# Patient Record
Sex: Male | Born: 1985
Health system: Southern US, Community
[De-identification: ages and names within clinical notes are randomized; demographics above are authoritative.]

## PROBLEM LIST (undated history)

## (undated) DIAGNOSIS — G4733 Obstructive sleep apnea (adult) (pediatric): Secondary | ICD-10-CM

## (undated) DIAGNOSIS — Z9989 Dependence on other enabling machines and devices: Secondary | ICD-10-CM

## (undated) DIAGNOSIS — T7840XA Allergy, unspecified, initial encounter: Secondary | ICD-10-CM

## (undated) DIAGNOSIS — R002 Palpitations: Secondary | ICD-10-CM

## (undated) DIAGNOSIS — G4730 Sleep apnea, unspecified: Secondary | ICD-10-CM

## (undated) DIAGNOSIS — Z87442 Personal history of urinary calculi: Secondary | ICD-10-CM

## (undated) DIAGNOSIS — Q248 Other specified congenital malformations of heart: Secondary | ICD-10-CM

## (undated) HISTORY — DX: Palpitations: R00.2

## (undated) HISTORY — DX: Obstructive sleep apnea (adult) (pediatric): G47.33

## (undated) HISTORY — DX: Sleep apnea, unspecified: G47.30

## (undated) HISTORY — PX: WISDOM TOOTH EXTRACTION: SHX21

## (undated) HISTORY — PX: MULTIPLE TOOTH EXTRACTIONS: SHX2053

## (undated) HISTORY — DX: Dependence on other enabling machines and devices: Z99.89

## (undated) HISTORY — DX: Allergy, unspecified, initial encounter: T78.40XA

---

## 2006-07-24 ENCOUNTER — Emergency Department (HOSPITAL_COMMUNITY): Admission: EM | Admit: 2006-07-24 | Discharge: 2006-07-24 | Payer: Self-pay | Admitting: Emergency Medicine

## 2012-06-29 ENCOUNTER — Emergency Department (HOSPITAL_COMMUNITY): Payer: 59

## 2012-06-29 ENCOUNTER — Emergency Department (HOSPITAL_COMMUNITY)
Admission: EM | Admit: 2012-06-29 | Discharge: 2012-06-29 | Disposition: A | Discharge status: Home / Self Care | Payer: 59 | Attending: Emergency Medicine | Admitting: Emergency Medicine

## 2012-06-29 ENCOUNTER — Encounter (HOSPITAL_COMMUNITY): Payer: Self-pay | Admitting: Emergency Medicine

## 2012-06-29 DIAGNOSIS — N201 Calculus of ureter: Secondary | ICD-10-CM

## 2012-06-29 DIAGNOSIS — R109 Unspecified abdominal pain: Secondary | ICD-10-CM | POA: Insufficient documentation

## 2012-06-29 LAB — URINALYSIS, ROUTINE W REFLEX MICROSCOPIC
Ketones, ur: NEGATIVE mg/dL
Leukocytes, UA: NEGATIVE
Nitrite: NEGATIVE
pH: 6 (ref 5.0–8.0)

## 2012-06-29 LAB — URINE MICROSCOPIC-ADD ON

## 2012-06-29 MED ORDER — IBUPROFEN 600 MG PO TABS: 600.0000 mg | ORAL_TABLET | Freq: Three times a day (TID) | ORAL | Status: AC | PRN

## 2012-06-29 MED ORDER — KETOROLAC TROMETHAMINE 30 MG/ML IJ SOLN
30.0000 mg | Freq: Once | INTRAMUSCULAR | Status: AC
  Administered 2012-06-29: 30 mg via INTRAVENOUS
  Filled 2012-06-29: qty 1

## 2012-06-29 MED ORDER — HYDROMORPHONE HCL PF 2 MG/ML IJ SOLN
2.0000 mg | Freq: Once | INTRAMUSCULAR | Status: AC
  Administered 2012-06-29: 2 mg via INTRAVENOUS
  Filled 2012-06-29: qty 1

## 2012-06-29 MED ORDER — ONDANSETRON HCL 4 MG/2ML IJ SOLN
4.0000 mg | Freq: Once | INTRAMUSCULAR | Status: AC
  Administered 2012-06-29: 4 mg via INTRAVENOUS
  Filled 2012-06-29: qty 2

## 2012-06-29 MED ORDER — TAMSULOSIN HCL 0.4 MG PO CAPS: 0.4000 mg | ORAL_CAPSULE | Freq: Every day | ORAL | Status: DC

## 2012-06-29 MED ORDER — SODIUM CHLORIDE 0.9 % IV SOLN
Freq: Once | INTRAVENOUS | Status: AC
  Administered 2012-06-29: 03:00:00 via INTRAVENOUS

## 2012-06-29 MED ORDER — OXYCODONE-ACETAMINOPHEN 5-325 MG PO TABS: 2.0000 | ORAL_TABLET | ORAL | Status: AC | PRN

## 2012-06-29 MED ORDER — ONDANSETRON 8 MG PO TBDP: 8.0000 mg | ORAL_TABLET | Freq: Three times a day (TID) | ORAL | Status: AC | PRN

## 2012-06-29 NOTE — ED Notes (Signed)
Pt alert, nad, c/o right flank pain, onset this evening, denies trauma or injury, denies changes in bowel or bladder, describes pain as sharp non radiating

## 2012-06-29 NOTE — ED Provider Notes (Signed)
History     CSN: [id number]  Arrival date & time 06/29/12  0137   First MD Initiated Contact with Patient 06/29/12 0155      Chief Complaint  Patient presents with  . Flank Pain    (Consider location/radiation/quality/duration/timing/severity/associated sxs/prior treatment) The history is provided by the patient.   the patient reports developing severe right flank pain that began approximately 35 minutes ago.  Reports the pain waxes and wanes.  When his pain becomes severe it is a 10 out of 10.  His pain is described as sharp.  Nothing worsens the symptoms.  Nothing improves his symptoms.  He's never had symptoms like this before.  Her history of kidney stones.  There is no radiation of his pain.  He has no fevers or chills.  He denies dysuria and urinary frequency.  he's had nausea and vomiting.  He denies diarrhea  History reviewed. No pertinent past medical history.  History reviewed. No pertinent past surgical history.  No family history on file.  History  Substance Use Topics  . Smoking status: Never Smoker   . Smokeless tobacco: Not on file  . Alcohol Use: No      Review of Systems  Genitourinary: Positive for flank pain.  All other systems reviewed and are negative.    Allergies  Review of patient's allergies indicates no known allergies.  Home Medications  No current outpatient prescriptions on file.  BP 148/90  Pulse 71  Temp 97.4 F (36.3 C) (Oral)  Resp 18  Wt 250 lb (113.399 kg)  SpO2 100%  Physical Exam  Nursing note and vitals reviewed. Constitutional: He is oriented to person, place, and time. He appears well-developed and well-nourished.  HENT:  Head: Normocephalic and atraumatic.  Eyes: EOM are normal.  Neck: Normal range of motion.  Cardiovascular: Normal rate, regular rhythm, normal heart sounds and intact distal pulses.   Pulmonary/Chest: Effort normal and breath sounds normal. No respiratory distress.  Abdominal: Soft. He exhibits no  distension. There is no tenderness.  Musculoskeletal: Normal range of motion.  Neurological: He is alert and oriented to person, place, and time.  Skin: Skin is warm and dry.  Psychiatric: He has a normal mood and affect. Judgment normal.    ED Course  Procedures (including critical care time)   Labs Reviewed  URINALYSIS, ROUTINE W REFLEX MICROSCOPIC   Ct Abdomen Pelvis Wo Contrast  06/29/2012  *RADIOLOGY REPORT*  Clinical Data: Right flank pain.  CT ABDOMEN AND PELVIS WITHOUT CONTRAST  Technique:  Multidetector CT imaging of the abdomen and pelvis was performed following the standard protocol without intravenous contrast.  Comparison: None.  Findings: The visualized lung bases are clear.  An apparent small epicardial cyst is noted, measuring 3.0 cm in size.  The liver and spleen are unremarkable in appearance.  The gallbladder is within normal limits.  The pancreas and adrenal glands are unremarkable.  There is very mild right-sided hydronephrosis, with mild perinephric stranding, and prominence of the right ureter to the level of an obstructing 4 mm stone in the distal right ureter, 2 cm proximal to the right vesicoureteral function.  The left kidney is unremarkable in appearance.  No nonobstructing renal stones are seen.  No free fluid is identified.  The small bowel is unremarkable in appearance.  The stomach is within normal limits.  No acute vascular abnormalities are seen.  The appendix is normal in caliber, without evidence for appendicitis.  The colon is unremarkable in appearance.  The  bladder is mildly distended and grossly unremarkable in appearance; a urachal remnant is incidentally noted.  No inguinal lymphadenopathy is seen.  No acute osseous abnormalities are identified.  IMPRESSION:  1.  Very mild right-sided hydronephrosis, with an obstructing 4 mm stone noted in the distal right ureter, 2 cm proximal to the right vesicoureteral junction. 2.  Small 3.0 cm epicardial cyst noted.   Original Report Authenticated By: Tonia Ghent, M.D.    I personally reviewed the imaging tests through PACS system    1. Right ureteral stone       MDM  The patient has had complete pain control.  He has a 4 mm right-sided ureteral stone.  He is well appearing and nontoxic.  He is given strict return instructions.  Urology followup.  The patient understands return the emergency department for new or worsening symptoms        Lyanne Co, MD 06/29/12 [phone]

## 2012-06-29 NOTE — ED Notes (Signed)
Pt c/o right flank pain that began about 30 mins ago. Pt states pain woke him from his sleep. Pt describes pain as stabbing. Pt denies chest pain, fever, headache, abdominal pain.

## 2017-11-08 ENCOUNTER — Ambulatory Visit (INDEPENDENT_AMBULATORY_CARE_PROVIDER_SITE_OTHER): Payer: BLUE CROSS/BLUE SHIELD | Admitting: Physician Assistant

## 2017-11-08 ENCOUNTER — Encounter: Payer: Self-pay | Admitting: Physician Assistant

## 2017-11-08 VITALS — BP 108/84 | HR 90 | Temp 98.3°F | Resp 16 | Ht 67.0 in | Wt 274.6 lb

## 2017-11-08 DIAGNOSIS — Z1389 Encounter for screening for other disorder: Secondary | ICD-10-CM

## 2017-11-08 DIAGNOSIS — Z13228 Encounter for screening for other metabolic disorders: Secondary | ICD-10-CM

## 2017-11-08 DIAGNOSIS — Z0000 Encounter for general adult medical examination without abnormal findings: Secondary | ICD-10-CM | POA: Diagnosis not present

## 2017-11-08 DIAGNOSIS — Z23 Encounter for immunization: Secondary | ICD-10-CM

## 2017-11-08 DIAGNOSIS — R0681 Apnea, not elsewhere classified: Secondary | ICD-10-CM

## 2017-11-08 DIAGNOSIS — Z130 Encounter for screening for diseases of the blood and blood-forming organs and certain disorders involving the immune mechanism: Secondary | ICD-10-CM | POA: Diagnosis not present

## 2017-11-08 DIAGNOSIS — Z13220 Encounter for screening for lipoid disorders: Secondary | ICD-10-CM | POA: Diagnosis not present

## 2017-11-08 LAB — POCT URINALYSIS DIP (MANUAL ENTRY)
Bilirubin, UA: NEGATIVE
Blood, UA: NEGATIVE
Glucose, UA: NEGATIVE mg/dL
Ketones, POC UA: NEGATIVE mg/dL
LEUKOCYTES UA: NEGATIVE
Nitrite, UA: NEGATIVE
PH UA: 5.5 (ref 5.0–8.0)
PROTEIN UA: NEGATIVE mg/dL
UROBILINOGEN UA: 0.2 U/dL

## 2017-11-08 NOTE — Progress Notes (Signed)
[name]  MRN: [id number] DOB: [birth year]  Subjective:  Pt is a 31 y.o. male who presents for annual physical exam. Pt is fasting today.   Last annual: Never Last dental exam: 2018, tries to brush twice daily Last vision exam: 2-3 years ago  Vaccinations      Tetanus: 2003      Influenza: never       Issues he would like to discuss: 1) Apneic episodes at night: Pt notes his wife has told him he wakes up gasping for air at least 3-4 times a night for the past 3 months. Has associated snoring at night. Often, he wakes up a lot not feeling completely rested. He will occasionally fall asleep while sitting and watching tv. He has fallen asleep while driving. He has not been in any accidents.    There are no active problems to display for this patient.   Current Outpatient Medications on File Prior to Visit  Medication Sig Dispense Refill  . cetirizine (ZYRTEC) 10 MG tablet Take 10 mg daily by mouth.    . pseudoephedrine (SUDAFED) 30 MG tablet Take 30 mg every 4 (four) hours as needed by mouth for congestion.     No current facility-administered medications on file prior to visit.     Allergies  Allergen Reactions  . Vicks Vaporub [Camph-Eucalypt-Men-Turp-Pet] Rash    Social History   Socioeconomic History  . Marital status: Single    Spouse name: None  . Number of children: None  . Years of education: None  . Highest education level: None  Social Needs  . Financial resource strain: None  . Food insecurity - worry: None  . Food insecurity - inability: None  . Transportation needs - medical: None  . Transportation needs - non-medical: None  Occupational History  . Occupation: Soil scientist.     Comment: [employer]  Tobacco Use  . Smoking status: Never Smoker  . Smokeless tobacco: Never Used  Substance and Sexual Activity  . Alcohol use: Yes    Comment: occ.  . Drug use: No  . Sexual activity: None  Other Topics Concern  . None  Social History  Narrative  . None    History reviewed. No pertinent surgical history.  Family History  Problem Relation Age of Onset  . Hypertension Father   . Hyperlipidemia Father   . Stomach cancer Maternal Grandfather   . Colon cancer Paternal Grandfather     Review of Systems  Constitutional: Negative for activity change, appetite change, chills, diaphoresis, fatigue, fever and unexpected weight change.  HENT: Positive for congestion, dental problem and ear pain. Negative for drooling, ear discharge, facial swelling, hearing loss, mouth sores, nosebleeds, postnasal drip, rhinorrhea, sinus pressure, sinus pain, sneezing, sore throat, tinnitus, trouble swallowing and voice change.   Eyes: Negative for photophobia, pain, discharge, redness, itching and visual disturbance.  Respiratory: Positive for apnea (episodes at night ). Negative for cough, choking, chest tightness, shortness of breath, wheezing and stridor.   Cardiovascular: Negative for chest pain, palpitations and leg swelling.  Gastrointestinal: Negative for abdominal distention, abdominal pain, anal bleeding, blood in stool, constipation, diarrhea, nausea, rectal pain and vomiting.  Endocrine: Negative for cold intolerance, heat intolerance, polydipsia, polyphagia and polyuria.  Genitourinary: Negative for decreased urine volume, difficulty urinating, discharge, dysuria, enuresis, flank pain, frequency, genital sores, hematuria, penile pain, penile swelling, scrotal swelling, testicular pain and urgency.  Musculoskeletal: Negative for arthralgias, back pain, gait problem, joint swelling, myalgias, neck pain  and neck stiffness.  Skin: Negative for color change, pallor, rash and wound.  Allergic/Immunologic: Negative for environmental allergies, food allergies and immunocompromised state.  Neurological: Negative for dizziness, tremors, seizures, syncope, facial asymmetry, speech difficulty, weakness, light-headedness, numbness and headaches.    Hematological: Negative for adenopathy. Does not bruise/bleed easily.  Psychiatric/Behavioral: Negative for agitation, behavioral problems, confusion, decreased concentration, dysphoric mood, hallucinations, self-injury, sleep disturbance and suicidal ideas. The patient is not nervous/anxious and is not hyperactive.     Objective:  BP 108/84   Pulse 90   Temp 98.3 F (36.8 C) (Oral)   Resp 16   Ht '5\' 7"'  (1.702 m)   Wt 274 lb 9.6 oz (124.6 kg)   SpO2 97%   BMI 43.01 kg/m   Physical Exam  Constitutional: He is oriented to person, place, and time and well-developed, well-nourished, and in no distress.  HENT:  Head: Normocephalic and atraumatic.  Right Ear: Hearing, tympanic membrane, external ear and ear canal normal.  Left Ear: Hearing, tympanic membrane, external ear and ear canal normal.  Nose: Mucosal edema (bilaterally) present.  Mouth/Throat: Uvula is midline, oropharynx is clear and moist and mucous membranes are normal. No oropharyngeal exudate.  Eyes: Conjunctivae and EOM are normal. Pupils are equal, round, and reactive to light.  Neck: Trachea normal and normal range of motion.  Neck circumference measures 48 cm.  Cardiovascular: Normal rate, regular rhythm, normal heart sounds and intact distal pulses.  Pulmonary/Chest: Effort normal and breath sounds normal.  Abdominal: Soft. Normal appearance and bowel sounds are normal.  Musculoskeletal: Normal range of motion.  Lymphadenopathy:       Head (right side): No submental, no submandibular, no tonsillar, no preauricular, no posterior auricular and no occipital adenopathy present.       Head (left side): No submental, no submandibular, no tonsillar, no preauricular, no posterior auricular and no occipital adenopathy present.    He has no cervical adenopathy.       Right: No supraclavicular adenopathy present.       Left: No supraclavicular adenopathy present.  Neurological: He is alert and oriented to person, place, and  time. He has normal sensation, normal strength and normal reflexes. Gait normal.  Skin: Skin is warm and dry.  Psychiatric: Affect normal.  Vitals reviewed.   Visual Acuity Screening   Right eye Left eye Both eyes  Without correction: '20/20 20/20 20/20 '  With correction:       Assessment and Plan :  Discussed healthy lifestyle, diet, exercise, preventative care, vaccinations, and addressed patient's concerns. Plan for follow up in one year. Otherwise, plan for specific conditions below.  1. Annual physical exam Await lab results.  2. Screening, anemia, deficiency, iron - CBC with Differential/Platelet  3. Screening for metabolic disorder - ZYY48+GNOI  4. Screening, lipi - Lipid panel  5. Screening for hematuria or proteinuria - POCT urinalysis dipstick  6. Need for Tdap vaccination - Tdap vaccine greater than or equal to 7yo IM  7. Apneic episode Due to hx of apneic episodes during sleep along with snoring, daytime sleepiness, obesity, and large neck circumference, pt would benefit from a sleep study at this time.  - Ambulatory referral to Sleep Studies  8. Need for immunization against influenza - Flu Vaccine QUAD 6+ mos PF IM (Fluarix Quad PF)  Tenna Delaine, PA-C  [facility] 11/08/2017 11:43 PM

## 2017-11-08 NOTE — Patient Instructions (Addendum)
Overall, your physical exam was great. Keep up the great work at exercising! Work on Standard Pacificchanging your diet to healthier options. We should have your lab work back within one week and will let you know your results. In terms of sleep study, I have placed a referral and they should contact you within 1-2 weeks. If you have not heard from them by then, please contact our office. It was great meeting you today! Thank you for letting me participate in your health and well being.    Health Maintenance, Male A healthy lifestyle and preventive care is important for your health and wellness. Ask your health care provider about what schedule of regular examinations is right for you. What should I know about weight and diet? Eat a Healthy Diet  Eat plenty of vegetables, fruits, whole grains, low-fat dairy products, and lean protein.  Do not eat a lot of foods high in solid fats, added sugars, or salt.  Maintain a Healthy Weight Regular exercise can help you achieve or maintain a healthy weight. You should:  Do at least 150 minutes of exercise each week. The exercise should increase your heart rate and make you sweat (moderate-intensity exercise).  Do strength-training exercises at least twice a week.  Watch Your Levels of Cholesterol and Blood Lipids  Have your blood tested for lipids and cholesterol every 5 years starting at 30 years of age. If you are at high risk for heart disease, you should start having your blood tested when you are 30 years old. You may need to have your cholesterol levels checked more often if: ? Your lipid or cholesterol levels are high. ? You are older than 30 years of age. ? You are at high risk for heart disease.  What should I know about cancer screening? Many types of cancers can be detected early and may often be prevented. Lung Cancer  You should be screened every year for lung cancer if: ? You are a current smoker who has smoked for at least 30 years. ? You are a  former smoker who has quit within the past 15 years.  Talk to your health care provider about your screening options, when you should start screening, and how often you should be screened.  Colorectal Cancer  Routine colorectal cancer screening usually begins at 30 years of age and should be repeated every 5-10 years until you are 30 years old. You may need to be screened more often if early forms of precancerous polyps or small growths are found. Your health care provider may recommend screening at an earlier age if you have risk factors for colon cancer.  Your health care provider may recommend using home test kits to check for hidden blood in the stool.  A small camera at the end of a tube can be used to examine your colon (sigmoidoscopy or colonoscopy). This checks for the earliest forms of colorectal cancer.  Prostate and Testicular Cancer  Depending on your age and overall health, your health care provider may do certain tests to screen for prostate and testicular cancer.  Talk to your health care provider about any symptoms or concerns you have about testicular or prostate cancer.  Skin Cancer  Check your skin from head to toe regularly.  Tell your health care provider about any new moles or changes in moles, especially if: ? There is a change in a mole's size, shape, or color. ? You have a mole that is larger than a pencil eraser.  Always use sunscreen. Apply sunscreen liberally and repeat throughout the day.  Protect yourself by wearing long sleeves, pants, a wide-brimmed hat, and sunglasses when outside.  What should I know about heart disease, diabetes, and high blood pressure?  If you are 54-34 years of age, have your blood pressure checked every 3-5 years. If you are 91 years of age or older, have your blood pressure checked every year. You should have your blood pressure measured twice-once when you are at a hospital or clinic, and once when you are not at a hospital or  clinic. Record the average of the two measurements. To check your blood pressure when you are not at a hospital or clinic, you can use: ? An automated blood pressure machine at a pharmacy. ? A home blood pressure monitor.  Talk to your health care provider about your target blood pressure.  If you are between 41-47 years old, ask your health care provider if you should take aspirin to prevent heart disease.  Have regular diabetes screenings by checking your fasting blood sugar level. ? If you are at a normal weight and have a low risk for diabetes, have this test once every three years after the age of 47. ? If you are overweight and have a high risk for diabetes, consider being tested at a younger age or more often.  A one-time screening for abdominal aortic aneurysm (AAA) by ultrasound is recommended for men aged 49-75 years who are current or former smokers. What should I know about preventing infection? Hepatitis B If you have a higher risk for hepatitis B, you should be screened for this virus. Talk with your health care provider to find out if you are at risk for hepatitis B infection. Hepatitis C Blood testing is recommended for:  Everyone born from [birth year] through 1964.  Anyone with known risk factors for hepatitis C.  Sexually Transmitted Diseases (STDs)  You should be screened each year for STDs including gonorrhea and chlamydia if: ? You are sexually active and are younger than 30 years of age. ? You are older than 30 years of age and your health care provider tells you that you are at risk for this type of infection. ? Your sexual activity has changed since you were last screened and you are at an increased risk for chlamydia or gonorrhea. Ask your health care provider if you are at risk.  Talk with your health care provider about whether you are at high risk of being infected with HIV. Your health care provider may recommend a prescription medicine to help prevent HIV  infection.  What else can I do?  Schedule regular health, dental, and eye exams.  Stay current with your vaccines (immunizations).  Do not use any tobacco products, such as cigarettes, chewing tobacco, and e-cigarettes. If you need help quitting, ask your health care provider.  Limit alcohol intake to no more than 2 drinks per day. One drink equals 12 ounces of beer, 5 ounces of wine, or 1 ounces of hard liquor.  Do not use street drugs.  Do not share needles.  Ask your health care provider for help if you need support or information about quitting drugs.  Tell your health care provider if you often feel depressed.  Tell your health care provider if you have ever been abused or do not feel safe at home. This information is not intended to replace advice given to you by your health care provider. Make sure you discuss any  questions you have with your health care provider. Document Released: 06/06/2008 Document Revised: 08/07/2016 Document Reviewed: 09/12/2015 Elsevier Interactive Patient Education  2018 [employer]   IF you received an x-ray today, you will receive an invoice from [facility]. Please contact [facility] at [phone] with questions or concerns regarding your invoice.   IF you received labwork today, you will receive an invoice from [facility]. Please contact [facility] at [phone] with questions or concerns regarding your invoice.   Our billing staff will not be able to assist you with questions regarding bills from these companies.  You will be contacted with the lab results as soon as they are available. The fastest way to get your results is to activate your My Chart account. Instructions are located on the last page of this paperwork. If you have not heard from us regarding the results in 2 weeks, please contact this office.

## 2017-11-09 LAB — CMP14+EGFR
ALT: 30 IU/L (ref 0–44)
AST: 21 IU/L (ref 0–40)
Albumin/Globulin Ratio: 1.5 (ref 1.2–2.2)
Albumin: 4.3 g/dL (ref 3.5–5.5)
Alkaline Phosphatase: 92 IU/L (ref 39–117)
BUN/Creatinine Ratio: 14 (ref 9–20)
BUN: 11 mg/dL (ref 6–20)
Bilirubin Total: 0.4 mg/dL (ref 0.0–1.2)
CALCIUM: 9.2 mg/dL (ref 8.7–10.2)
CO2: 23 mmol/L (ref 20–29)
CREATININE: 0.76 mg/dL (ref 0.76–1.27)
Chloride: 107 mmol/L — ABNORMAL HIGH (ref 96–106)
GFR calc Af Amer: 142 mL/min/{1.73_m2} (ref >59)
GFR, EST NON AFRICAN AMERICAN: 122 mL/min/{1.73_m2} (ref >59)
GLUCOSE: 86 mg/dL (ref 65–99)
Globulin, Total: 2.9 g/dL (ref 1.5–4.5)
Potassium: 4 mmol/L (ref 3.5–5.2)
SODIUM: 144 mmol/L (ref 134–144)
Total Protein: 7.2 g/dL (ref 6.0–8.5)

## 2017-11-09 LAB — CBC WITH DIFFERENTIAL/PLATELET
BASOS ABS: 0.1 10*3/uL (ref 0.0–0.2)
Basos: 1 %
EOS (ABSOLUTE): 0.2 10*3/uL (ref 0.0–0.4)
EOS: 3 %
HEMATOCRIT: 40.7 % (ref 37.5–51.0)
Hemoglobin: 14.4 g/dL (ref 13.0–17.7)
IMMATURE GRANULOCYTES: 0 %
Immature Grans (Abs): 0 10*3/uL (ref 0.0–0.1)
LYMPHS ABS: 2.7 10*3/uL (ref 0.7–3.1)
Lymphs: 29 %
MCH: 29.1 pg (ref 26.6–33.0)
MCHC: 35.4 g/dL (ref 31.5–35.7)
MCV: 82 fL (ref 79–97)
MONOS ABS: 0.6 10*3/uL (ref 0.1–0.9)
Monocytes: 7 %
NEUTROS PCT: 60 %
Neutrophils Absolute: 5.5 10*3/uL (ref 1.4–7.0)
PLATELETS: 287 10*3/uL (ref 150–379)
RBC: 4.95 x10E6/uL (ref 4.14–5.80)
RDW: 14 % (ref 12.3–15.4)
WBC: 9.1 10*3/uL (ref 3.4–10.8)

## 2017-11-09 LAB — LIPID PANEL
CHOL/HDL RATIO: 4.1 ratio (ref 0.0–5.0)
Cholesterol, Total: 178 mg/dL (ref 100–199)
HDL: 43 mg/dL (ref >39)
LDL CALC: 117 mg/dL — AB (ref 0–99)
TRIGLYCERIDES: 91 mg/dL (ref 0–149)
VLDL Cholesterol Cal: 18 mg/dL (ref 5–40)

## 2017-12-17 ENCOUNTER — Ambulatory Visit: Payer: BLUE CROSS/BLUE SHIELD | Admitting: Neurology

## 2017-12-17 ENCOUNTER — Encounter: Payer: Self-pay | Admitting: Neurology

## 2017-12-17 VITALS — BP 130/84 | HR 81 | Ht 67.0 in | Wt 278.0 lb

## 2017-12-17 DIAGNOSIS — J3081 Allergic rhinitis due to animal (cat) (dog) hair and dander: Secondary | ICD-10-CM | POA: Diagnosis not present

## 2017-12-17 DIAGNOSIS — Z6841 Body Mass Index (BMI) 40.0 and over, adult: Secondary | ICD-10-CM

## 2017-12-17 DIAGNOSIS — G4733 Obstructive sleep apnea (adult) (pediatric): Secondary | ICD-10-CM | POA: Diagnosis not present

## 2017-12-17 MED ORDER — TRIAMCINOLONE ACETONIDE 55 MCG/ACT NA AERO: 2.0000 | INHALATION_SPRAY | Freq: Every day | NASAL | 12 refills | Status: DC

## 2017-12-17 NOTE — Progress Notes (Signed)
SLEEP MEDICINE CLINIC   Provider:  Melvyn Novas, M D  Primary Care Physician:  Magdalene River, PA-C   Referring Provider: Magdalene River, PA*  Chief Complaint  Patient presents with  . New Patient (Initial Visit)    pt wife has told him he snores and stops breathing in his sleep. pt alone, rm 10.    HPI:  [name] is a 31 y.o. male , seen here  in a referral  from PA. Barnett Abu for an evaluation of sleep disordered breathing.  I have the pleasure of meeting with [name] today on 17 December 2017, Caucasian 31 year old married gentleman, right-handed who presents with a complaint of waking up gasping for air, his wife has witnessed him to have apnea and snoring.  He also feels that his sleep no longer has the same restorative and refreshing quality it used to have.  He wakes up up to 4 times at night not always sure what wakes him usually being able to go back to sleep he has on occasion fall asleep while sitting watching TV, and has fallen asleep while driving 2 month ago.   Sleep habits are as follows: The patient usually aims for a bedtime of 10 PM, while he is trying to go to sleep his wife does watch TV in the marital bedroom.  He himself prefers to watch TV in the den before he goes to bed.  He estimates 15 minutes of sleep latency, and then usually can sleep for several hours before waking up for the first time.  Aside from the running tv his bedroom, the bedroom is usually cool, quiet and dark.  The patient states that he goes more often to the bathroom when he actually has the urge to urinate, it is that he awakens for other reasons and then uses that interruption in sleep for a bathroom break. He wakes up with a very dry mouth, often times with diaphoresis, not palpitations.  He usually does not have headaches, dizziness or nausea. He estimates 7-8 hours of nocturnal sleep. Rises at 6.30 AM and starts working from home by 7.30 AM. His employer is in [place].    Sleep medical history and family sleep history:   The patient does not recollect having any childhood sleep disorders such as night terrors, enuresis or sleep walking.  Did not  undergo tonsillectomy. NO history of TBI or neck injury. No hospitalizations.  No Asthma , but sister is affected.   Social history: Patient is able to work from home, is a Geographical information systems officer.  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  His non-smoker, he drinks socially alcohol-3 drinks a month, he does consume caffeine.  He drinks hot tea in the morning, and disorders in the afternoon.  Around 5 caffeinated beverages a day he is not a shift Financial controller. Married, no children, one dog.   Review of Systems: Out of a complete 14 system review, the patient complains of only the following symptoms, and all other reviewed systems are negative. Obesity, snoring, apnea witnessed, increased anxiety and depression- stress related- separated before Christmas.   Epworth score 10 , Fatigue severity score 22  , depression score n/a -    Social History   Socioeconomic History  . Marital status: Married    Spouse name: [name]  . Number of children: 0  . Years of education: College  . Highest education level: Associate degree: academic program  Social Needs  . Financial  resource strain: Not hard at all  . Food insecurity - worry: Never true  . Food insecurity - inability: Never true  . Transportation needs - medical: No  . Transportation needs - non-medical: No  Occupational History  . Occupation: Art gallery managerData base Admin.     Comment: [employer]  Tobacco Use  . Smoking status: Never Smoker  . Smokeless tobacco: Never Used  Substance and Sexual Activity  . Alcohol use: Yes    Comment: occ.  . Drug use: No  . Sexual activity: Yes    Partners: Female    Comment: with monogamous wife  Other Topics Concern  . Not on file  Social History Narrative   Diet: Average American diet: breakfast is  cereal or oatmeal, lunch is typical a lean cuisine, dinner is typical spaghetti, meat and sides, tacos, and hamburgers. Eats veggies. Does not eat a lot of fruit. Drinks mostly diet sodas and sweet tea. Trying to drink more water.       Exercise: He is exercising 2-3 days a week. Goes to the park and does brisk walking for about an hour. Trying to increase it to 5 days a week.     Family History  Problem Relation Age of Onset  . Hypertension Father   . Hyperlipidemia Father   . Stomach cancer Maternal Grandfather   . Colon cancer Paternal Grandfather     Current Outpatient Medications  Medication Sig Dispense Refill  . cetirizine (ZYRTEC) 10 MG tablet Take 10 mg daily by mouth.    . pseudoephedrine (SUDAFED) 30 MG tablet Take 30 mg every 4 (four) hours as needed by mouth for congestion.     No current facility-administered medications for this visit.     Allergies as of 12/17/2017 - Review Complete 12/17/2017  Allergen Reaction Noted  . Vicks vaporub [camph-eucalypt-men-turp-pet] Rash 11/08/2017    Vitals: BP 130/84   Pulse 81   Ht 5\' 7"  (1.702 m)   Wt 278 lb (126.1 kg)   BMI 43.54 kg/m  Last Weight:  Wt Readings from Last 1 Encounters:  12/17/17 278 lb (126.1 kg)   ZOX:WRUEBMI:Body mass index is 43.54 kg/m.     Last Height:   Ht Readings from Last 1 Encounters:  12/17/17 5\' 7"  (1.702 m)    Physical exam:  General: The patient is awake, alert and appears not in acute distress. The patient is well groomed. Head: Normocephalic, atraumatic. Neck is supple. Mallampati 5 , neck circumference:19. 25 . Nasal airflow patent -  Retrognathia is seen. Small lower jaw.  Used to wear braces.  Cardiovascular:  Regular rate and rhythm, without  murmurs or carotid bruit, and without distended neck veins. Respiratory: Lungs are clear to auscultation. Skin:  Without evidence of edema, or rash Trunk: BMI is 44. The patient's posture is stooped.   Neurologic exam : The patient is awake and  alert, oriented to place and time.   Memory subjective described as intact.  Attention span & concentration ability appears normal.  Speech is fluent,  without dysarthria, dysphonia or aphasia.  Mood and affect are appropriate.  Cranial nerves: Pupils are equal and briskly reactive to light.Funduscopic exam without evidence of pallor or edema. Extraocular movements  in vertical and horizontal planes intact and without nystagmus. Visual fields by finger perimetry are intact.Hearing to finger rub intact. Facial sensation intact to fine touch.Facial motor strength is symmetric and tongue and uvula move midline. Shoulder shrug was symmetrical.   Motor exam:   Normal  tone, muscle bulk and symmetric strength in all extremities. Sensory:  Fine touch, pinprick and vibration were tested in all extremities. Proprioception tested in the upper extremities was normal. Coordination: Rapid alternating movements in the fingers/hands was normal. Finger-to-nose maneuver  normal without evidence of ataxia, dysmetria or tremor. Gait and station: Patient walks without assistive device .Tandem gait is unfragmented.  Deep tendon reflexes: in the  upper and lower extremities are symmetric and intact.    Assessment:  After physical and neurologic examination, review of laboratory studies,  Personal review of imaging studies, reports of other /same  Imaging studies, results of polysomnography and / or neurophysiology testing and pre-existing records as far as provided in visit., my assessment is   1) OSA ? [name] was observed and loud snoring, but he has at this point no significant comorbidities except for morbid obesity.  He does not have heart disease, pulmonary disorders, and neither does he suffer from diabetes and never had a stroke or heart attack. At this time I would like for him to undergo an attended  SPLIT sleep test, with a goal of measuring 3% oxygen desaturations as apnea screen him for the presence of  sleep apnea.  Treatment can be initiated after the AHI of 20 has been reached.   2) Obesity- BMI 40  weight management - refer to Dr. Dalbert GarnetBeasley.   3)  Fatigue and sleepiness,  Feeling overwhelmed can be related to non sleep issues, such as the very recent separation form his spouse.    The patient was advised of the nature of the diagnosed disorder , the treatment options and the  risks for general health and wellness arising from not treating the condition.   I spent more than 45 minutes of face to face time with the patient.  Greater than 50% of time was spent in counseling and coordination of care. We have discussed the diagnosis and differential and I answered the patient's questions.    Plan:  Treatment plan and additional workup :   SPLIT night, 3 % and SPLIT at AHI 20.  Sleep aid Amitriptyline- lowest dose.   Melvyn NovasARMEN Loula Marcella, MD 12/17/2017, 1:13 PM  Certified in Neurology by ABPN Certified in Sleep Medicine by [facility]  [facility] [address], [address] [place], [place] [id number]

## 2018-01-23 ENCOUNTER — Ambulatory Visit (INDEPENDENT_AMBULATORY_CARE_PROVIDER_SITE_OTHER): Payer: BLUE CROSS/BLUE SHIELD | Admitting: Neurology

## 2018-01-23 DIAGNOSIS — Z6841 Body Mass Index (BMI) 40.0 and over, adult: Secondary | ICD-10-CM

## 2018-01-23 DIAGNOSIS — G4733 Obstructive sleep apnea (adult) (pediatric): Secondary | ICD-10-CM

## 2018-01-23 DIAGNOSIS — J3081 Allergic rhinitis due to animal (cat) (dog) hair and dander: Secondary | ICD-10-CM

## 2018-01-30 ENCOUNTER — Other Ambulatory Visit: Payer: Self-pay | Admitting: Neurology

## 2018-01-30 DIAGNOSIS — G4733 Obstructive sleep apnea (adult) (pediatric): Secondary | ICD-10-CM

## 2018-01-30 DIAGNOSIS — G4734 Idiopathic sleep related nonobstructive alveolar hypoventilation: Principal | ICD-10-CM

## 2018-01-30 DIAGNOSIS — G4700 Insomnia, unspecified: Secondary | ICD-10-CM

## 2018-01-30 DIAGNOSIS — G4730 Sleep apnea, unspecified: Secondary | ICD-10-CM

## 2018-01-30 DIAGNOSIS — G4719 Other hypersomnia: Secondary | ICD-10-CM

## 2018-01-30 DIAGNOSIS — Z6841 Body Mass Index (BMI) 40.0 and over, adult: Secondary | ICD-10-CM

## 2018-01-30 NOTE — Procedures (Signed)
PATIENT'S NAME:  [name], [name] DOB:      09/03/1986      MR#:    [id number]     DATE OF RECORDING: 01/23/2018 REFERRING M.D.:  Jerry CoreBrittany Wiseman PA-C Study Performed:  Split-Night Titration Study HISTORY:  [name] is a right handed Caucasian 32 year old married man who presents with a complaint of waking up gasping for air. His wife has witnessed him to have apnea and snoring.  He also feels that his sleep no longer has the same restorative and refreshing quality it used to have.  He wakes 3 to 4 times at night - he has been falling asleep while sitting watching TV, and has fallen asleep while driving 2 month ago. He endorsed respiratory allergies and morbid obesity The patient endorsed the Epworth Sleepiness Scale at 10 points.   The patient's weight 278 pounds with a height of 67 (inches), resulting in a BMI of 43.6 kg/m2. The patient's neck circumference measured 19.3 inches.  CURRENT MEDICATIONS: Zyrtec, Sudafed,  PROCEDURE:  This is a multichannel digital polysomnogram utilizing the Somnostar 11.2 system.  Electrodes and sensors were applied and monitored per AASM Specifications.   EEG, EOG, Chin and Limb EMG, were sampled at 200 Hz.  ECG, Snore and Nasal Pressure, Thermal Airflow, Respiratory Effort, CPAP Flow and Pressure, Oximetry was sampled at 50 Hz. Digital video and audio were recorded.      BASELINE STUDY WITHOUT CPAP RESULTS: Lights Out was at 21:05 and Lights On at 05:00.  Total recording time (TRT) was 101.5, with a total sleep time (TST) of 91.5 minutes.   The patient's sleep latency was 3.5 minutes.  REM latency was 0 minutes.  The sleep efficiency was 90.1 %.    SLEEP ARCHITECTURE: WASO (Wake after sleep onset) was 0.5 minutes, Stage N1 was 5 minutes, Stage N2 was 84.5 minutes, Stage N3 was 2 minutes and Stage R (REM sleep) was 0 minutes.  The percentages were Stage N1 5.5%, Stage N2 92.3%, Stage N3 2.2% and Stage R (REM sleep) 0%.   RESPIRATORY ANALYSIS:  There  were a total of 127 respiratory events:  101 obstructive apneas, 26 hypopneas without additional respiratory event related arousals (RERAs). Loud Snoring was noted.    The total APNEA/HYPOPNEA INDEX (AHI) was 83.3 /hour and the total RESPIRATORY DISTURBANCE INDEX was 83.3 /hour.  0 events occurred in REM sleep and 52 events in NREM. The REM AHI was 0, /hour versus a non-REM AHI of 83.3 /hour. The patient spent 315.5 minutes of sleep time in the supine position, and 138 minutes in non-supine. The supine AHI was 83.2 /hour versus a non-supine AHI of 0.0 /hour.  OXYGEN SATURATION & C02:  The wake baseline 02 saturation was 96%, with the lowest being 69%. Time spent below 89% saturation equaled 81 minutes.  PERIODIC LIMB MOVEMENTS:   The patient had a total of 0 Periodic Limb Movements.   The arousals were noted as: 1 was spontaneous, 0 were associated with PLMs, and 112 were associated with respiratory events. Audio and video analysis did not show any abnormal or unusual movements, behaviors, phonations or vocalizations. No nocturia. NSR in EKG. Loud Snoring was noted   TITRATION STUDY WITH CPAP RESULTS:   CPAP was initiated at 6 cmH20 with heated humidity per AASM split night standards and pressure was advanced to 10 cmH20 because of hypopneas, apneas and desaturations.  At a PAP pressure of 10 cmH20, there was a sleep efficiency of 99.8%, and reduction of the  AHI to 0.0 /hour.   Total recording time (TRT) was 374 minutes, with a total sleep time (TST) of 361.5 minutes. The patient's sleep latency was 14.5 minutes. REM latency was 18.5 minutes.  The sleep efficiency was 96.7 %.    SLEEP ARCHITECTURE: Wake after sleep was 6.5 minutes, Stage N1 7.5 minutes, Stage N2 110.5 minutes, Stage N3 93 minutes and Stage R (REM sleep) 150.5 minutes. The percentages were: Stage N1 2.1%, Stage N2 30.6%, Stage N3 25.7% and Stage R (REM sleep) 41.6%. The sleep architecture was notable for supine REM sleep. RESPIRATORY  ANALYSIS:  There were a total of 13 respiratory events: 0 apneas and 13 hypopneas with 0 respiratory event related arousals (RERAs).   The total APNEA/HYPOPNEA INDEX (AHI) was 2.2 /hour and the total RESPIRATORY DISTURBANCE INDEX was 2.2 /hour.  2 events occurred in REM sleep and 11 events in NREM. The REM AHI was 0.8 /hour versus a non-REM AHI of 3.1 /hour. The patient spent 62% of total sleep time in the supine position. The supine AHI was 3.2 /hour, versus a non-supine AHI of 0.4/hour.  OXYGEN SATURATION & C02:  The wake baseline 02 saturation was 98%, with the lowest being 75%. Time spent below 89% saturation equaled 2 minutes.  PERIODIC LIMB MOVEMENTS:    The patient had a total of 0 Periodic Limb Movements. The arousals were noted as: 7 were spontaneous, 0 were associated with PLMs, and 4 were associated with respiratory events.  Post-study, the patient indicated that sleep was better than usual. The patient was fitted with a nasal pillow in small size, a ResMed AirFit P10.   POLYSOMNOGRAPHY IMPRESSION :   1. Severe Obstructive Sleep Apnea (OSA) at AHI of 83.3/hr. SPLIT protocol was initiated after only one hour of baseline study.  2. Severe intermittent hypoxia was associated with apnea, to a nadir of 69%.  3. Loud Primary Snoring. 4. All named conditions improved with CPAP titration.   RECOMMENDATIONS:  1. Advise to start the patient at auto CPAP of 8 through 13 cm H2O. A ResMed AirFit P 10 in small size was used with heated humidity during this study.  Advise to add heated humidity.  Adjust interface and heated humidity as needed.     2. Compliance to PAP-therapy should be emphasized. Compliance, AHI and air leak information to be downloaded for objective assessment at 30 days, 180 days and annually thereafter.   3. Advise to lose weight by diet and exercise if not contraindicated (BMI over 40). Further information regarding OSA may be obtained from [employer]  (www.sleepfoundation.org) or American Sleep Apnea Association (www.sleepapnea.org). 4. A follow up appointment will be scheduled in the Sleep Clinic at [facility].      I certify that I have reviewed the entire raw data recording prior to the issuance of this report in accordance with the Standards of Accreditation of the American Academy of Sleep Medicine (AASM)      Melvyn Novas, M.D.    01-30-2018  Diplomat, American Board of Psychiatry and Neurology  Diplomat, American Board of Sleep Medicine Medical Director, [facility]

## 2018-02-02 ENCOUNTER — Telehealth: Payer: Self-pay | Admitting: Neurology

## 2018-02-02 NOTE — Telephone Encounter (Signed)
-----   Message from Melvyn Novasarmen Dohmeier, MD sent at 01/30/2018 12:12 PM EST ----- The following diagnosis were found during this SPLIT PSG study:  1)Severe Obstructive Sleep Apnea (OSA) at AHI ( apneas and hypopneas per hour of sleep )  of 83.3/hr. SPLIT protocol was initiated after only one hour of baseline study.  2. Severe intermittent hypoxia was associated with apnea, to a  nadir of 69%.  3. Loud Primary Snoring. 4. All named conditions improved with CPAP titration.  RECOMMENDATIONS:  1. Advise to start the patient on auto titration CPAP of 8 through 13 cm  H2O pressure.  A ResMed AirFit P 10 in small size was used with heated  humidity during this study. Advise to add and adjust heated humidity and interface as needed..Marland Kitchen

## 2018-02-02 NOTE — Telephone Encounter (Signed)
I called pt. I advised pt that Dr. Vickey Hugerohmeier reviewed their sleep study results and found that pt has severe sleep apnea. Dr. Vickey Hugerohmeier recommends that pt starts auto CPAP. I reviewed PAP compliance expectations with the pt. Pt is agreeable to starting a CPAP. I advised pt that an order will be sent to a DME, [facility], and [facility] will call the pt within about one week after they file with the pt's insurance. [facility] will show the pt how to use the machine, fit for masks, and troubleshoot the CPAP if needed. A follow up appt was made for insurance purposes with Sarita Bottomarolyn Martin,NP  on 04/27/2018 at 12:45 pm. Pt verbalized understanding to arrive 15 minutes early and bring their CPAP. A letter with all of this information in it will be mailed to the pt as a reminder. I verified with the pt that the address we have on file is correct. Pt verbalized understanding of results. Pt had no questions at this time but was encouraged to call back if questions arise.

## 2018-02-16 ENCOUNTER — Encounter: Payer: Self-pay | Admitting: Neurology

## 2018-04-23 NOTE — Progress Notes (Signed)
[facility]  PATIENT: [name] DOB: 19-Jan-1986   REASON FOR VISIT: Follow-up for newly diagnosed severe sleep apnea here for initial CPAP HISTORY FROM: Patient   HISTORY OF PRESENT ILLNESS:UPDATE 5/6/2019CM Mr.[name], 32 year old male returns for follow-up with newly diagnosed severe obstructive sleep apnea here for his initial CPAP compliance.  He reports that he is doing well with his CPAP he does have significant history of environmental allergies.  CPAP data dated 03/28/2018 to 04/26/2018 shows greater than 4 hours at 80%.  Average usage 6 hours and 5 minutes.  Set pressure of 13 cm AHI 4.5.  ESS 3 he returns for reevaluation   [name] is a 32 y.o. male , seen here  in a referral  from PA. [name] for an evaluation of sleep disordered breathing.  I have the pleasure of meeting with [name] today on 17 December 2017, Caucasian 32 year old married gentleman, right-handed who presents with a complaint of waking up gasping for air, his wife has witnessed him to have apnea and snoring.  He also feels that his sleep no longer has the same restorative and refreshing quality it used to have.  He wakes up up to 4 times at night not always sure what wakes him usually being able to go back to sleep he has on occasion fall asleep while sitting watching TV, and has fallen asleep while driving 2 month ago.   Sleep habits are as follows: The patient usually aims for a bedtime of 10 PM, while he is trying to go to sleep his wife does watch TV in the marital bedroom.  He himself prefers to watch TV in the den before he goes to bed.  He estimates 15 minutes of sleep latency, and then usually can sleep for several hours before waking up for the first time.  Aside from the running tv his bedroom, the bedroom is usually cool, quiet and dark.  The patient states that he goes more often to the bathroom when he actually has the urge to urinate, it is that he awakens for other reasons  and then uses that interruption in sleep for a bathroom break. He wakes up with a very dry mouth, often times with diaphoresis, not palpitations.  He usually does not have headaches, dizziness or nausea. He estimates 7-8 hours of nocturnal sleep. Rises at 6.30 AM and starts working from home by 7.30 AM. His employer is in [place].     REVIEW OF SYSTEMS: Full 14 system review of systems performed and notable only for those listed, all others are neg:  Constitutional: neg  Cardiovascular: neg Ear/Nose/Throat: neg  Skin: neg Eyes: neg Respiratory: neg Gastroitestinal: neg  Hematology/Lymphatic: neg  Endocrine: neg Musculoskeletal: Joint pain Allergy/Immunology: neg Neurological: neg Psychiatric: neg Sleep : Obstructive sleep apnea with CPAP   ALLERGIES: Allergies  Allergen Reactions  . Vicks Vaporub [Camph-Eucalypt-Men-Turp-Pet] Rash    HOME MEDICATIONS: Outpatient Medications Prior to Visit  Medication Sig Dispense Refill  . cetirizine (ZYRTEC) 10 MG tablet Take 10 mg daily by mouth.    . triamcinolone (NASACORT) 55 MCG/ACT AERO nasal inhaler Place 2 sprays into the nose daily. 1 Inhaler 12   No facility-administered medications prior to visit.     PAST MEDICAL HISTORY: Past Medical History:  Diagnosis Date  . OSA on CPAP     PAST SURGICAL HISTORY: History reviewed. No pertinent surgical history.  FAMILY HISTORY: Family History  Problem Relation Age of Onset  . Hypertension Father   .  Hyperlipidemia Father   . Stomach cancer Maternal Grandfather   . Colon cancer Paternal Grandfather     SOCIAL HISTORY: Social History   Socioeconomic History  . Marital status: Married    Spouse name: [name]  . Number of children: 0  . Years of education: College  . Highest education level: Associate degree: academic program  Occupational History  . Occupation: Art gallery manager.     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 Financial resource strain: Not hard at  all  . Food insecurity:    Worry: Never true    Inability: Never true  . Transportation needs:    Medical: No    Non-medical: No  Tobacco Use  . Smoking status: Never Smoker  . Smokeless tobacco: Never Used  Substance and Sexual Activity  . Alcohol use: Yes    Comment: occ.  . Drug use: No  . Sexual activity: Yes    Partners: Female    Comment: with monogamous wife  Lifestyle  . Physical activity:    Days per week: 3 days    Minutes per session: 60 min  . Stress: Not on file  Relationships  . Social connections:    Talks on phone: Twice a week    Gets together: Once a week    Attends religious service: Never    Active member of club or organization: No    Attends meetings of clubs or organizations: Never    Relationship status: Married  . Intimate partner violence:    Fear of current or ex partner: No    Emotionally abused: No    Physically abused: No    Forced sexual activity: No  Other Topics Concern  . Not on file  Social History Narrative   Diet: Average American diet: breakfast is cereal or oatmeal, lunch is typical a lean cuisine, dinner is typical spaghetti, meat and sides, tacos, and hamburgers. Eats veggies. Does not eat a lot of fruit. Drinks mostly diet sodas and sweet tea. Trying to drink more water.       Exercise: He is exercising 2-3 days a week. Goes to the park and does brisk walking for about an hour. Trying to increase it to 5 days a week.      PHYSICAL EXAM  Vitals:   04/27/18 1236  BP: 124/85  Pulse: 61  Weight: 281 lb 9.6 oz (127.7 kg)  Height:  (1.702 m)   Body mass index is 44.1 kg/m.  Generalized: Well developed, morbidly obese male in no acute distress  Head: normocephalic and atraumatic,. Oropharynx benign Mallopatti 5 Neck: Supple, circumference 19.25 Musculoskeletal: No deformity   Neurological examination   Mentation: Alert oriented to time, place, history taking. Attention span and concentration appropriate. Recent and  remote memory intact.  Follows all commands speech and language fluent.   Cranial nerve II-XII: Pupils were equal round reactive to light extraocular movements were full, visual field were full on confrontational test. Facial sensation and strength were normal. hearing was intact to finger rubbing bilaterally. Uvula tongue midline. head turning and shoulder shrug were normal and symmetric.Tongue protrusion into cheek strength was normal. Motor: normal bulk and tone, full strength in the BUE, BLE, Sensory: normal and symmetric to light touch, Coordination: finger-nose-finger, heel-to-shin bilaterally, no dysmetria Gait and Station: Rising up from seated position without assistance, normal stance,  moderate stride, good arm swing, smooth turning, able to perform tiptoe, and heel walking without difficulty. Tandem gait is steady  DIAGNOSTIC  DATA (LABS, IMAGING, TESTING) - I reviewed patient records, labs, notes, testing and imaging myself where available.  Lab Results  Component Value Date   WBC 9.1 11/08/2017   HGB 14.4 11/08/2017   HCT 40.7 11/08/2017   MCV 82 11/08/2017   PLT 287 11/08/2017      Component Value Date/Time   NA 144 11/08/2017 1603   K 4.0 11/08/2017 1603   CL 107 (H) 11/08/2017 1603   CO2 23 11/08/2017 1603   GLUCOSE 86 11/08/2017 1603   BUN 11 11/08/2017 1603   CREATININE 0.76 11/08/2017 1603   CALCIUM 9.2 11/08/2017 1603   PROT 7.2 11/08/2017 1603   ALBUMIN 4.3 11/08/2017 1603   AST 21 11/08/2017 1603   ALT 30 11/08/2017 1603   ALKPHOS 92 11/08/2017 1603   BILITOT 0.4 11/08/2017 1603   GFRNONAA 122 11/08/2017 1603   GFRAA 142 11/08/2017 1603   Lab Results  Component Value Date   CHOL 178 11/08/2017   HDL 43 11/08/2017   LDLCALC 117 (H) 11/08/2017   TRIG 91 11/08/2017   CHOLHDL 4.1 11/08/2017    ASSESSMENT AND PLAN  32 y.o. year old male  has a past medical history of OSA on CPAP. here to follow-up for newly diagnosed severe obstructive sleep apnea,  here for initial CPAP compliance.data dated 03/28/2018 to 04/26/2018 shows greater than 4 hours at 80%.  Average usage 6 hours and 5 minutes.  Set pressure of 13 cm AHI 4.5.  ESS 3    CPAP compliance 80% Continue same settings Follow-up in 6 months for repeat compliance then yearly Nilda Riggs, [facility], [facility], APRN  [facility] : [facility] [address] [place], [place] [address] [phone]

## 2018-04-26 ENCOUNTER — Encounter: Payer: Self-pay | Admitting: Neurology

## 2018-04-27 ENCOUNTER — Ambulatory Visit: Payer: BLUE CROSS/BLUE SHIELD | Admitting: Nurse Practitioner

## 2018-04-27 ENCOUNTER — Encounter: Payer: Self-pay | Admitting: Nurse Practitioner

## 2018-04-27 DIAGNOSIS — Z9989 Dependence on other enabling machines and devices: Secondary | ICD-10-CM | POA: Diagnosis not present

## 2018-04-27 DIAGNOSIS — G4733 Obstructive sleep apnea (adult) (pediatric): Secondary | ICD-10-CM | POA: Diagnosis not present

## 2018-04-27 NOTE — Patient Instructions (Signed)
CPAP compliance 80% Continue same settings Follow-up in 6 months for repeat compliance

## 2018-05-01 NOTE — Progress Notes (Signed)
I agree with the assessment and plan as directed by NP .The patient is known to me .   Twanda Stakes, MD  

## 2018-10-06 ENCOUNTER — Encounter: Payer: Self-pay | Admitting: Physician Assistant

## 2018-10-06 ENCOUNTER — Ambulatory Visit: Payer: BLUE CROSS/BLUE SHIELD | Admitting: Physician Assistant

## 2018-10-06 VITALS — BP 133/83 | HR 70 | Temp 98.0°F | Resp 17 | Ht 67.0 in | Wt 219.0 lb

## 2018-10-06 DIAGNOSIS — B079 Viral wart, unspecified: Secondary | ICD-10-CM | POA: Diagnosis not present

## 2018-10-06 DIAGNOSIS — J339 Nasal polyp, unspecified: Secondary | ICD-10-CM | POA: Diagnosis not present

## 2018-10-06 MED ORDER — FLUTICASONE PROPIONATE 50 MCG/ACT NA SUSP: 2.0000 | Freq: Every day | NASAL | 6 refills | Status: DC

## 2018-10-06 NOTE — Progress Notes (Signed)
     MRN: [id number] DOB: 1986/08/19  Subjective:   [name] is a 32 y.o. male presenting for chief complaint of Sinusitis and wart on knee .  Reports 2 week history of intermittent  nasal congestion, itchy watery eyes, and sneezing. Worse at the end of the day. Works from home, walks his dog frequently. Takes zyrtec daily with some relief. Denies fever, sinus pain, rhinorrhea, ear pain, sore throat, wheezing, shortness of breath, chest tightness, chest pain, myalgia and cough, nausea, vomiting, abdominal pain and diarrhea. PMH of seasonal allergies and OSA.    Wart on right knee x 1-2 year. Started bothering him. Tried OTC gel and freezing, but it came back. Denies redness, swelling, warmth, and purulent drainage.   [name] has a current medication list which includes the following prescription(s): cetirizine. Also is allergic to vicks vaporub [camph-eucalypt-men-turp-pet].  [name]  has a past medical history of OSA on CPAP. Also  has no past surgical history on file.   Objective:   Vitals: BP 133/83   Pulse 70   Temp 98 F (36.7 C) (Oral)   Resp 17   Ht 5\' 7"  (1.702 m)   Wt 219 lb (99.3 kg)   SpO2 98%   BMI 34.30 kg/m   Physical Exam  Constitutional: He is oriented to person, place, and time. He appears well-developed and well-nourished. No distress.  HENT:  Head: Normocephalic and atraumatic.  Nose: Mucosal edema present. Right sinus exhibits no maxillary sinus tenderness and no frontal sinus tenderness. Left sinus exhibits no maxillary sinus tenderness and no frontal sinus tenderness.  Mouth/Throat: Uvula is midline, oropharynx is clear and moist and mucous membranes are normal. No tonsillar exudate.  Moderate sized nasal polyp noted in right nostril, no full obstruction.   Eyes: Conjunctivae are normal.  Neck: Normal range of motion.  Pulmonary/Chest: Effort normal.  Neurological: He is alert and oriented to person, place, and time.  Skin: Skin is warm and dry.      Psychiatric: He has a normal mood and affect.  Vitals reviewed.   No results found for this or any previous visit (from the past 24 hour(s)).  PROCEDURE NOTE: Cryotherapy Discussed risks vs benefits. Verbal consent obtained. Cleansed skin with alcohol prep pad. Cryotherapy applied (freeze-thaw-freeze) to wart. The patient tolerated the procedure well.  Assessment and Plan :  1. Viral warts, unspecified type Cryotherapy performed. Return in 3 weeks if no full resolution.   2. Nasal polyp Rec starting flonase. Continue zyrtec. F/u if no improvement in 2-4 weeks. Consider referral to ENT if no improvement at that time.  - fluticasone (FLONASE) 50 MCG/ACT nasal spray; Place 2 sprays into both nostrils daily.  Dispense: 16 g; Refill: 6   Benjiman Core, PA-C  [facility] 10/06/2018 3:00 PM

## 2018-10-06 NOTE — Patient Instructions (Addendum)
For nasal polyp, use flonase twice daily. Continue zyrtec. If no improvement after 2-4 weeks, please return.   acute response to cryotherapy ranges from minimal erythema to hemorrhagic blistering, pain, and tenderness. Healing usually occurs within four to seven days. Local hypopigmentation can occur. If no full resolution in 3 weeks, return to office.    If you have lab work done today you will be contacted with your lab results within the next 2 weeks.  If you have not heard from [place] then please contact us. The fastest way to get your results is to register for My Chart.   Nasal Polyps Nasal polyps are growths that form in the nose. Irritation and swelling (inflammation) in the nose or sinus openings can lead to changes in the tissue (mucosa) that lines these areas. Long-term inflammation causes the mucosa to balloon out or grow into a polyp. The polyp fills with watery mucus. Nasal polyps look like moist, gray grapes in the nose.Nasal polyps are not cancer. They do not increase your risk of cancer. You may have one nasal polyp or more than one. They can be small or large. In most cases, they form in both sides of the nose. Polyps can make it hard to breathe through your nose (nasal obstruction). What are the causes? The exact cause of nasal polyps is not known. What increases the risk? You are more likely to develop nasal polyps if you:  Have a family history of the condition.  Have a disease that causes inflammation in your nose or sinuses.  Have another condition that affects your nose or sinuses, such as: ? Nasal allergies (allergic rhinitis). ? Long-term nasal obstruction (nonallergic rhinitis). ? Asthma. ? Nasal or sinus infection, especially fungal infection.  Are male.  Are older than 31 years of age.  Have a sensitivity to aspirin or alcohol.  Smoke.  Have a disease passed down through families that causes increased production of thick mucus (cystic fibrosis).  What are  the signs or symptoms? Symptoms depend on the size of the polyps. Small polyps may cause few symptoms. When symptoms develop, they may include:  Nasal obstruction.  Decreased senses of smell and taste.  Runny nose.  The feeling of mucus going down the back of the throat (postnasal drip).  Headache, face pain, or sinus pressure.  Snoring.  Frequent nasal or sinus infections.  Itchy eyes.  How is this diagnosed?  Nasal polyps may be diagnosed based on your symptoms, your medical history, and a physical exam of the inside of your nose. You may also have tests, such as:  Imaging studies such as a CT scan or MRI to see how large your polyps are and if any are in your sinuses.  Skin or blood tests to find out if your polyps may be caused by allergies.  Washings or swabs taken from your nose to test for inflammation or infection.  How is this treated? Small nasal polyps that are not causing symptoms may not need treatment. For large polyps that are causing symptoms, the goal of treatment is to reduce nasal obstruction and improve sinus drainage. Treatment may include:  A medicine to reduce inflammation (steroid). This is usually the first treatment. You may have to take steroids for a short or long period of time. Short-term steroids are usually taken as pills. Long-term steroid treatment is usually in the form of nose drops or spray.  Medicines to treat an underlying condition, such as allergies, asthma, or infection.  Surgery.  This may be needed to remove nasal polyps if medicine does not help.  Follow these instructions at home:  Take or use over-the-counter and prescription medicines only as told by your health care provider. Do not stop using your medicine even if you start to feel better.  Use solutions to wash or rinse out the inside of your nose (nasal washes or irrigations) as told by your health care provider.  Do not take medicines that contain aspirin if they make  your symptoms worse.  Do not drink alcohol if it makes your symptoms worse.  Do not use any tobacco products, such as cigarettes, chewing tobacco, and e-cigarettes. If you need help quitting, ask your health care provider.  Keep all follow-up visits as told by your health care provider. This is important. Contact a health care provider if:  Your condition does not get better or it gets worse at home after treatment.  You have a fever.  You have headaches or pain in your face that is new or is getting worse.  You have a bloody nose. This information is not intended to replace advice given to you by your health care provider. Make sure you discuss any questions you have with your health care provider. Document Released: 04/01/2016 Document Revised: 05/16/2016 Document Reviewed: 02/29/2016 Elsevier Interactive Patient Education  2018 [employer].  IF you received an x-ray today, you will receive an invoice from [facility]. Please contact [facility] at [phone] with questions or concerns regarding your invoice.   IF you received labwork today, you will receive an invoice from [facility]. Please contact [facility] at [phone] with questions or concerns regarding your invoice.   Our billing staff will not be able to assist you with questions regarding bills from these companies.  You will be contacted with the lab results as soon as they are available. The fastest way to get your results is to activate your My Chart account. Instructions are located on the last page of this paperwork. If you have not heard from [place] regarding the results in 2 weeks, please contact this office.

## 2018-10-26 ENCOUNTER — Encounter: Payer: Self-pay | Admitting: Nurse Practitioner

## 2018-10-27 NOTE — Progress Notes (Signed)
[facility]  PATIENT: [name] DOB: [birth year]   REASON FOR VISIT: Follow-up for newly diagnosed severe sleep apnea here for  CPAP HISTORY FROM: Patient   HISTORY OF PRESENT ILLNESS:UPDATE 11/6/2019CM [name], 32 year old male returns for follow-up with severe obstructive sleep apnea here for CPAP compliance.  He feels much more rested on his CPAP.  He has much less daytime drowsiness.  Data dated 09/27/2018-10/26/2018 shows percent of days used greater than 4 hours at 100%.  Average usage 8 hours 14 minutes pressure 8 to 13 cm.  No leak.  AHI 3.6. ESS 2. He returns for reevaluation    UPDATE 5/6/2019CM Mr.[name], 32 year old male returns for follow-up with newly diagnosed severe obstructive sleep apnea here for his initial CPAP compliance.  He reports that he is doing well with his CPAP he does have significant history of environmental allergies.  CPAP data dated 03/28/2018 to 04/26/2018 shows greater than 4 hours at 80%.  Average usage 6 hours and 5 minutes.  Set pressure of 13 cm AHI 4.5.  ESS 3 he returns for reevaluation   [name] is a 33 y.o. male , seen here  in a referral  from PA. [name] for an evaluation of sleep disordered breathing.  I have the pleasure of meeting with [name] today on 17 December 2017, Caucasian 32 year old married gentleman, right-handed who presents with a complaint of waking up gasping for air, his wife has witnessed him to have apnea and snoring.  He also feels that his sleep no longer has the same restorative and refreshing quality it used to have.  He wakes up up to 4 times at night not always sure what wakes him usually being able to go back to sleep he has on occasion fall asleep while sitting watching TV, and has fallen asleep while driving 2 month ago.   Sleep habits are as follows: The patient usually aims for a bedtime of 10 PM, while he is trying to go to sleep his wife does watch TV in the marital bedroom.  He  himself prefers to watch TV in the den before he goes to bed.  He estimates 15 minutes of sleep latency, and then usually can sleep for several hours before waking up for the first time.  Aside from the running tv his bedroom, the bedroom is usually cool, quiet and dark.  The patient states that he goes more often to the bathroom when he actually has the urge to urinate, it is that he awakens for other reasons and then uses that interruption in sleep for a bathroom break. He wakes up with a very dry mouth, often times with diaphoresis, not palpitations.  He usually does not have headaches, dizziness or nausea. He estimates 7-8 hours of nocturnal sleep. Rises at 6.30 AM and starts working from home by 7.30 AM. His employer is in [place].     REVIEW OF SYSTEMS: Full 14 system review of systems performed and notable only for those listed, all others are neg:  Constitutional: neg  Cardiovascular: neg Ear/Nose/Throat: neg  Skin: neg Eyes: neg Respiratory: neg Gastroitestinal: neg  Hematology/Lymphatic: neg  Endocrine: neg Musculoskeletal: Joint pain Allergy/Immunology: neg Neurological: neg Psychiatric: neg Sleep : Obstructive sleep apnea with CPAP   ALLERGIES: Allergies  Allergen Reactions  . Vicks Vaporub [Camph-Eucalypt-Men-Turp-Pet] Rash    HOME MEDICATIONS: Outpatient Medications Prior to Visit  Medication Sig Dispense Refill  . cetirizine (ZYRTEC) 10 MG tablet Take 10 mg daily by mouth.    Marland Kitchen  fluticasone (FLONASE) 50 MCG/ACT nasal spray Place 2 sprays into both nostrils daily. 16 g 6   No facility-administered medications prior to visit.     PAST MEDICAL HISTORY: Past Medical History:  Diagnosis Date  . OSA on CPAP     PAST SURGICAL HISTORY: History reviewed. No pertinent surgical history.  FAMILY HISTORY: Family History  Problem Relation Age of Onset  . Hypertension Father   . Hyperlipidemia Father   . Stomach cancer Maternal Grandfather   . Colon cancer  Paternal Grandfather     SOCIAL HISTORY: Social History   Socioeconomic History  . Marital status: Divorced    Spouse name: [name]  . Number of children: 0  . Years of education: College  . Highest education level: Associate degree: academic program  Occupational History  . 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 Financial resource strain: Not hard at all  . Food insecurity:    Worry: Never true    Inability: Never true  . Transportation needs:    Medical: No    Non-medical: No  Tobacco Use  . Smoking status: Never Smoker  . Smokeless tobacco: Never Used  Substance and Sexual Activity  . Alcohol use: Yes    Comment: occ.  . Drug use: No  . Sexual activity: Yes    Partners: Female    Comment: with monogamous wife  Lifestyle  . Physical activity:    Days per week: 3 days    Minutes per session: 60 min  . Stress: Not on file  Relationships  . Social connections:    Talks on phone: Twice a week    Gets together: Once a week    Attends religious service: Never    Active member of club or organization: No    Attends meetings of clubs or organizations: Never    Relationship status: Married  . Intimate partner violence:    Fear of current or ex partner: No    Emotionally abused: No    Physically abused: No    Forced sexual activity: No  Other Topics Concern  . Not on file  Social History Narrative   Diet: Average American diet: breakfast is cereal or oatmeal, lunch is typical a lean cuisine, dinner is typical spaghetti, meat and sides, tacos, and hamburgers. Eats veggies. Does not eat a lot of fruit. Drinks mostly diet sodas and sweet tea. Trying to drink more water.       Exercise: He is exercising 2-3 days a week. Goes to the park and does brisk walking for about an hour. Trying to increase it to 5 days a week.      PHYSICAL EXAM  Vitals:   10/28/18 1315  BP: (!) 139/96  Pulse: 71  Weight: 284 lb 6.4 oz (129 kg)    Height: 5\' 7"  (1.702 m)   Body mass index is 44.54 kg/m.  Generalized: Well developed, morbidly obese male in no acute distress  Head: normocephalic and atraumatic,. Oropharynx benign Mallopatti 5 Neck: Supple, circumference 19.25 Lungs clear Musculoskeletal: No deformity  Skin no rash or edema Neurological examination   Mentation: Alert oriented to time, place, history taking. Attention span and concentration appropriate. Recent and remote memory intact.  Follows all commands speech and language fluent.   Cranial nerve II-XII: Pupils were equal round reactive to light extraocular movements were full, visual field were full on confrontational test. Facial sensation and strength were normal. hearing was intact  to finger rubbing bilaterally. Uvula tongue midline. head turning and shoulder shrug were normal and symmetric.Tongue protrusion into cheek strength was normal. Motor: normal bulk and tone, full strength in the BUE, BLE, Sensory: normal and symmetric to light touch, Coordination: finger-nose-finger, heel-to-shin bilaterally, no dysmetria Gait and Station: Rising up from seated position without assistance, normal stance,  moderate stride, good arm swing, smooth turning, able to perform tiptoe, and heel walking without difficulty. Tandem gait is steady  DIAGNOSTIC DATA (LABS, IMAGING, TESTING) - I reviewed patient records, labs, notes, testing and imaging myself where available.  Lab Results  Component Value Date   WBC 9.1 11/08/2017   HGB 14.4 11/08/2017   HCT 40.7 11/08/2017   MCV 82 11/08/2017   PLT 287 11/08/2017      Component Value Date/Time   NA 144 11/08/2017 1603   K 4.0 11/08/2017 1603   CL 107 (H) 11/08/2017 1603   CO2 23 11/08/2017 1603   GLUCOSE 86 11/08/2017 1603   BUN 11 11/08/2017 1603   CREATININE 0.76 11/08/2017 1603   CALCIUM 9.2 11/08/2017 1603   PROT 7.2 11/08/2017 1603   ALBUMIN 4.3 11/08/2017 1603   AST 21 11/08/2017 1603   ALT 30 11/08/2017  1603   ALKPHOS 92 11/08/2017 1603   BILITOT 0.4 11/08/2017 1603   GFRNONAA 122 11/08/2017 1603   GFRAA 142 11/08/2017 1603   Lab Results  Component Value Date   CHOL 178 11/08/2017   HDL 43 11/08/2017   LDLCALC 117 (H) 11/08/2017   TRIG 91 11/08/2017   CHOLHDL 4.1 11/08/2017    ASSESSMENT AND PLAN  32 y.o. year old male  has a past medical history of OSA on CPAP. here to follow-up for severe obstructive sleep apnea, here for  CPAP compliance.Data dated 09/27/2018-10/26/2018 shows percent of days used greater than 4 hours at 100%.  Average usage 8 hours 14 minutes pressure 8 to 13 cm.  No leak.  AHI 3.6. ESS 2.   PLAN: CPAP compliance 100% Continue same settings Follow-up yearly Nilda Riggs, [facility], [facility], APRN  [facility] [address], [address] [place], [place] [address] [phone]

## 2018-10-28 ENCOUNTER — Encounter: Payer: Self-pay | Admitting: Nurse Practitioner

## 2018-10-28 ENCOUNTER — Ambulatory Visit: Payer: BLUE CROSS/BLUE SHIELD | Admitting: Nurse Practitioner

## 2018-10-28 VITALS — BP 139/96 | HR 71 | Ht 67.0 in | Wt 284.4 lb

## 2018-10-28 DIAGNOSIS — Z9989 Dependence on other enabling machines and devices: Secondary | ICD-10-CM | POA: Diagnosis not present

## 2018-10-28 DIAGNOSIS — G4733 Obstructive sleep apnea (adult) (pediatric): Secondary | ICD-10-CM

## 2018-10-28 NOTE — Patient Instructions (Signed)
CPAP compliance 100% Continue same settings Follow-up yearly  

## 2018-11-21 ENCOUNTER — Encounter: Payer: Self-pay | Admitting: Physician Assistant

## 2018-11-21 ENCOUNTER — Other Ambulatory Visit: Payer: Self-pay

## 2018-11-21 ENCOUNTER — Ambulatory Visit: Payer: BLUE CROSS/BLUE SHIELD | Admitting: Physician Assistant

## 2018-11-21 VITALS — BP 131/85 | HR 68 | Temp 98.2°F | Resp 16 | Ht 68.0 in | Wt 284.8 lb

## 2018-11-21 DIAGNOSIS — J339 Nasal polyp, unspecified: Secondary | ICD-10-CM

## 2018-11-21 DIAGNOSIS — B078 Other viral warts: Secondary | ICD-10-CM | POA: Insufficient documentation

## 2018-11-21 NOTE — Patient Instructions (Addendum)
  You will receive a phone call (in 1-2 weeks) to schedule an appointment with ENT   If you need another freezing session, come back in about 4 weeks.    Thank you for coming in today. I hope you feel we met your needs.  Feel free to call PCP if you have any questions or further requests.  Please consider signing up for MyChart if you do not already have it, as this is a great way to communicate with me.  Best,  [employer], PA-C

## 2018-11-21 NOTE — Progress Notes (Signed)
   Cardell PeachBradley Fillinger  MRN: [id number] DOB: 07/13/1986  PCP: Patient, No Pcp Per  Subjective:  Pt is a 32 year old male who presents to clinic for several complaints.  Nasal congestion - "feels like something swollen in right nosrtil. blew out some darker color blood, more drainage than normal in last couple of days."  zyrtec daily. Was advised flonase 10/15 for nasal polyp.   Wart on right knee. He has tried otc gel and freezing. Received cryotherapy 10/15. Asked to RTC if no resolution.   Review of Systems  Constitutional: Negative for diaphoresis and fatigue.  HENT: Positive for congestion. Negative for postnasal drip, rhinorrhea, sinus pressure and sinus pain.   Respiratory: Negative for cough and wheezing.     Patient Active Problem List   Diagnosis Date Noted  . Obstructive sleep apnea treated with continuous positive airway pressure (CPAP) 04/27/2018  . Class 3 severe obesity due to excess calories with body mass index (BMI) of 40.0 to 44.9 in adult (HCC) 12/17/2017  . OSA (obstructive sleep apnea) 12/17/2017    Current Outpatient Medications on File Prior to Visit  Medication Sig Dispense Refill  . cetirizine (ZYRTEC) 10 MG tablet Take 10 mg daily by mouth.    . fluticasone (FLONASE) 50 MCG/ACT nasal spray Place 2 sprays into both nostrils daily. 16 g 6   No current facility-administered medications on file prior to visit.     Allergies  Allergen Reactions  . Vicks Vaporub [Camph-Eucalypt-Men-Turp-Pet] Rash     Objective:  BP 131/85 (BP Location: Right Arm, Patient Position: Sitting, Cuff Size: Large)   Pulse 68   Temp 98.2 F (36.8 C) (Oral)   Resp 16   Ht 5\' 8"  (1.727 m)   Wt 284 lb 12.8 oz (129.2 kg)   SpO2 96%   BMI 43.30 kg/m   Physical Exam  Constitutional: He appears well-developed and well-nourished.  HENT:  Nasal polyp right nares   Skin: Skin is warm and dry.     Psychiatric: He has a normal mood and affect. Thought content normal.  Vitals  reviewed.   PROCEDURE NOTE: Cryotherapy Discussed risks vs benefits. Verbal consent obtained. Cleansed skin with alcohol prep pad. Cryotherapy applied (freeze-thaw-freeze) to wart. The patient tolerated the procedure well. Assessment and Plan :  1. Common wart - Cryotherapy performed again. RTC in about one month if in need of another cryotherapy session.   2. Nasal polyp - nonresolving and worsening. Plan to refer to ENT - Ambulatory referral to ENT    Marco CollieWhitney Devun Anna, PA-C  [facility] at [facility] 11/21/2018 11:37 AM  Please note: Portions of this report may have been transcribed using dragon voice recognition software. Every effort was made to ensure accuracy; however, inadvertent computerized transcription errors may be present.

## 2018-12-01 ENCOUNTER — Other Ambulatory Visit: Payer: Self-pay | Admitting: Otolaryngology

## 2018-12-01 DIAGNOSIS — J300 Vasomotor rhinitis: Secondary | ICD-10-CM

## 2018-12-02 ENCOUNTER — Ambulatory Visit
Admission: RE | Admit: 2018-12-02 | Discharge: 2018-12-02 | Disposition: A | Discharge status: Home / Self Care | Payer: 59 | Source: Ambulatory Visit | Attending: Otolaryngology | Admitting: Otolaryngology

## 2018-12-02 DIAGNOSIS — J300 Vasomotor rhinitis: Secondary | ICD-10-CM

## 2018-12-03 ENCOUNTER — Ambulatory Visit: Payer: BLUE CROSS/BLUE SHIELD | Admitting: Emergency Medicine

## 2019-01-21 NOTE — Pre-Procedure Instructions (Signed)
[name]  01/21/2019      Karin Golden at [facility] [address] [place] - [address] [facility] [address] [place] [address] Phone: [phone] Fax: [phone]    Your procedure is scheduled on February 7  Report to [facility] Admitting at 0530 A.M.  Call this number if you have problems the morning of surgery:  [phone]   Remember:  Do not eat or drink after midnight.   Take these medicines the morning of surgery with A SIP OF WATER  cetirizine (ZYRTEC)  triamcinolone (NASACORT ALLERGY 24HR)   7 days prior to surgery STOP taking any Aspirin (unless otherwise instructed by your surgeon), Aleve, Naproxen, Ibuprofen, Motrin, Advil, Goody's, BC's, all herbal medications, fish oil, and all vitamins.     Do not wear jewelry, make-up or nail polish.  Do not wear lotions, powders, or perfumes, or deodorant.  Do not shave 48 hours prior to surgery.  Men may shave face and neck.  Do not bring valuables to the hospital.  [facility] is not responsible for any belongings or valuables.  Contacts, dentures or bridgework may not be worn into surgery.  Leave your suitcase in the car.  After surgery it may be brought to your room.  For patients admitted to the hospital, discharge time will be determined by your treatment team.  Patients discharged the day of surgery will not be allowed to drive home.    Special instructions:   [place]- Preparing For Surgery  Before surgery, you can play an important role. Because skin is not sterile, your skin needs to be as free of germs as possible. You can reduce the number of germs on your skin by washing with CHG (chlorahexidine gluconate) Soap before surgery.  CHG is an antiseptic cleaner which kills germs and bonds with the skin to continue killing germs even after washing.    Oral Hygiene is also important to reduce your risk of infection.  Remember - BRUSH YOUR TEETH THE MORNING OF SURGERY  WITH YOUR REGULAR TOOTHPASTE  Please do not use if you have an allergy to CHG or antibacterial soaps. If your skin becomes reddened/irritated stop using the CHG.  Do not shave (including legs and underarms) for at least 48 hours prior to first CHG shower. It is OK to shave your face.  Please follow these instructions carefully.   1. Shower the NIGHT BEFORE SURGERY and the MORNING OF SURGERY with CHG.   2. If you chose to wash your hair, wash your hair first as usual with your normal shampoo.  3. After you shampoo, rinse your hair and body thoroughly to remove the shampoo.  4. Use CHG as you would any other liquid soap. You can apply CHG directly to the skin and wash gently with a scrungie or a clean washcloth.   5. Apply the CHG Soap to your body ONLY FROM THE NECK DOWN.  Do not use on open wounds or open sores. Avoid contact with your eyes, ears, mouth and genitals (private parts). Wash Face and genitals (private parts)  with your normal soap.  6. Wash thoroughly, paying special attention to the area where your surgery will be performed.  7. Thoroughly rinse your body with warm water from the neck down.  8. DO NOT shower/wash with your normal soap after using and rinsing off the CHG Soap.  9. Pat yourself dry with a CLEAN TOWEL.  10. Wear CLEAN PAJAMAS  to bed the night before surgery, wear comfortable clothes the morning of surgery  11. Place CLEAN SHEETS on your bed the night of your first shower and DO NOT SLEEP WITH PETS.    Day of Surgery:  Do not apply any deodorants/lotions.  Please wear clean clothes to the hospital/surgery center.   Remember to brush your teeth WITH YOUR REGULAR TOOTHPASTE.    Please read over the following fact sheets that you were given.

## 2019-01-22 ENCOUNTER — Encounter (HOSPITAL_COMMUNITY): Payer: Self-pay

## 2019-01-22 ENCOUNTER — Other Ambulatory Visit: Payer: Self-pay

## 2019-01-22 ENCOUNTER — Encounter (HOSPITAL_COMMUNITY)
Admission: RE | Admit: 2019-01-22 | Discharge: 2019-01-22 | Disposition: A | Discharge status: Home / Self Care | Payer: 59 | Source: Ambulatory Visit | Attending: Otolaryngology | Admitting: Otolaryngology

## 2019-01-22 DIAGNOSIS — Z01812 Encounter for preprocedural laboratory examination: Secondary | ICD-10-CM | POA: Insufficient documentation

## 2019-01-22 HISTORY — DX: Other specified congenital malformations of heart: Q24.8

## 2019-01-22 HISTORY — DX: Personal history of urinary calculi: Z87.442

## 2019-01-22 LAB — CBC
HCT: 42.1 % (ref 39.0–52.0)
Hemoglobin: 14.2 g/dL (ref 13.0–17.0)
MCH: 29 pg (ref 26.0–34.0)
MCHC: 33.7 g/dL (ref 30.0–36.0)
MCV: 86.1 fL (ref 80.0–100.0)
NRBC: 0 % (ref 0.0–0.2)
Platelets: 286 10*3/uL (ref 150–400)
RBC: 4.89 MIL/uL (ref 4.22–5.81)
RDW: 13.1 % (ref 11.5–15.5)
WBC: 8.6 10*3/uL (ref 4.0–10.5)

## 2019-01-22 LAB — BASIC METABOLIC PANEL WITH GFR
Anion gap: 7 (ref 5–15)
BUN: 8 mg/dL (ref 6–20)
CO2: 26 mmol/L (ref 22–32)
Calcium: 8.7 mg/dL — ABNORMAL LOW (ref 8.9–10.3)
Chloride: 108 mmol/L (ref 98–111)
Creatinine, Ser: 0.79 mg/dL (ref 0.61–1.24)
GFR calc Af Amer: >60 mL/min
GFR calc non Af Amer: >60 mL/min
Glucose, Bld: 82 mg/dL (ref 70–99)
Potassium: 3.6 mmol/L (ref 3.5–5.1)
Sodium: 141 mmol/L (ref 135–145)

## 2019-01-22 NOTE — Progress Notes (Addendum)
PCP - Primary Care at [address] Cardiologist - denies  Chest x-ray - not needed EKG - not needed Stress Test - denies ECHO - denies Cardiac Cath - denies  Sleep Study - requesting  CPAP - at night instructed patient to bring mask the day of surgery Anesthesia review: pateint has a pericardial cyst dx from abdominal Ct patient was told that he did not need follow-up.  Patient is asymptomatic  Patient denies shortness of breath, fever, cough and chest pain at PAT appointment   Patient verbalized understanding of instructions that were given to them at the PAT appointment. Patient was also instructed that they will need to review over the PAT instructions again at home before surgery.

## 2019-01-25 ENCOUNTER — Ambulatory Visit: Payer: Self-pay | Admitting: Otolaryngology

## 2019-01-25 NOTE — Anesthesia Preprocedure Evaluation (Addendum)
Anesthesia Evaluation  Patient identified by MRN, date of birth, ID band Patient awake    Reviewed: Allergy & Precautions, NPO status , Patient's Chart, lab work & pertinent test results  Airway Mallampati: III  TM Distance: <3 FB Neck ROM: Full    Dental no notable dental hx.    Pulmonary sleep apnea and Continuous Positive Airway Pressure Ventilation ,    Pulmonary exam normal breath sounds clear to auscultation       Cardiovascular negative cardio ROS Normal cardiovascular exam Rhythm:Regular Rate:Normal     Neuro/Psych negative neurological ROS  negative psych ROS   GI/Hepatic negative GI ROS, Neg liver ROS,   Endo/Other  Morbid obesity  Renal/GU negative Renal ROS  negative genitourinary   Musculoskeletal negative musculoskeletal ROS (+)   Abdominal   Peds negative pediatric ROS (+)  Hematology negative hematology ROS (+)   Anesthesia Other Findings   Reproductive/Obstetrics negative OB ROS                             Anesthesia Physical Anesthesia Plan  ASA: III  Anesthesia Plan: General   Post-op Pain Management:    Induction: Intravenous  PONV Risk Score and Plan: 2 and Ondansetron, Dexamethasone and Treatment may vary due to age or medical condition  Airway Management Planned: Oral ETT  Additional Equipment:   Intra-op Plan:   Post-operative Plan: Extubation in OR  Informed Consent: I have reviewed the patients History and Physical, chart, labs and discussed the procedure including the risks, benefits and alternatives for the proposed anesthesia with the patient or authorized representative who has indicated his/her understanding and acceptance.     Dental advisory given  Plan Discussed with: CRNA and Surgeon  Anesthesia Plan Comments: (Small 3.0 cm epicardial cyst noted on CT 2013. Per pt no f/u was recommended. Asymptomatic.)       Anesthesia Quick  Evaluation

## 2019-01-25 NOTE — H&P (Signed)
PREOPERATIVE H&P  Chief Complaint: nasal polyps with nasal obstruction  HPI: [name] is a 33 y.o. male who presents for evaluation of nasal polyps. He has obstruction mostly on the right side. A CT scan demonstrated a large right intranasal polyp with mild sinus disease on the right side. He does have a septal deviation to the left. He is taken to the operating room this time for septoplasty turbinate reductions and limited FESS with removal of nasal polyp. He has history of obstructive sleep apnea and will require overnight nasal packing in overnight observation in the hospital.   Past Medical History:  Diagnosis Date  . History of kidney stones   . OSA on CPAP   . Pericardial cyst    dx from Ct Abdomen 2013 - no follow up   Past Surgical History:  Procedure Laterality Date  . MULTIPLE TOOTH EXTRACTIONS    . WISDOM TOOTH EXTRACTION     Social History   Socioeconomic History  . Marital status: Divorced    Spouse name: Marcelino DusterMichelle  . Number of children: 0  . Years of education: College  . Highest education level: Associate degree: academic program  Occupational History  . Occupation: Art gallery managerData base Admin.     Comment: [employer]  Social Needs  . Financial resource strain: Not hard at all  . Food insecurity:    Worry: Never true    Inability: Never true  . Transportation needs:    Medical: No    Non-medical: No  Tobacco Use  . Smoking status: Never Smoker  . Smokeless tobacco: Never Used  Substance and Sexual Activity  . Alcohol use: Yes    Comment: occ.  . Drug use: No  . Sexual activity: Yes    Partners: Female    Comment: with monogamous wife  Lifestyle  . Physical activity:    Days per week: 3 days    Minutes per session: 60 min  . Stress: Not on file  Relationships  . Social connections:    Talks on phone: Twice a week    Gets together: Once a week    Attends religious service: Never    Active member of club or organization: No    Attends  meetings of clubs or organizations: Never    Relationship status: Married  Other Topics Concern  . Not on file  Social History Narrative   Diet: Average American diet: breakfast is cereal or oatmeal, lunch is typical a lean cuisine, dinner is typical spaghetti, meat and sides, tacos, and hamburgers. Eats veggies. Does not eat a lot of fruit. Drinks mostly diet sodas and sweet tea. Trying to drink more water.       Exercise: He is exercising 2-3 days a week. Goes to the park and does brisk walking for about an hour. Trying to increase it to 5 days a week.       Patient is currently going though a divorce   Family History  Problem Relation Age of Onset  . Hypertension Father   . Hyperlipidemia Father   . Stomach cancer Maternal Grandfather   . Colon cancer Paternal Grandfather    Allergies  Allergen Reactions  . Vicks Vaporub [Camph-Eucalypt-Men-Turp-Pet] Rash   Prior to Admission medications   Medication Sig Start Date End Date Taking? Authorizing Provider  cetirizine (ZYRTEC) 10 MG tablet Take 10 mg daily by mouth.   Yes [provider]  triamcinolone (NASACORT ALLERGY 24HR) 55 MCG/ACT AERO nasal inhaler Place 2 sprays into  the nose daily.   Yes [provider]  fluticasone (FLONASE) 50 MCG/ACT nasal spray Place 2 sprays into both nostrils daily. Patient not taking: Reported on 01/13/2019 10/06/18   Benjiman Core D, PA-C     Positive ROS: negative except for nasal obstruction  All other systems have been reviewed and were otherwise negative with the exception of those mentioned in the HPI and as above.  Physical Exam: There were no vitals filed for this visit.  General: Alert, no acute distress Oral: Normal oral mucosa and tonsils Nasal: septal deviation to the left.Large right intranasal polyp Neck: No palpable adenopathy or thyroid nodules Ear: Ear canal is clear with normal appearing TMs Cardiovascular: Regular rate and rhythm, no murmur.   Respiratory: Clear to auscultation Neurologic: Alert and oriented x 3   Assessment/Plan: DEVIATED SEPTUM,VASOMOTOR RHINITIS,HYPERTROPHY OF TURBINATE,POLYP NASAL OBSTRUCTION Plan for Procedure(s): NASAL SEPTOPLASTY WITH BILATERAL TURBINATE REDUCTION SEPTOPLASTY WITH ETHMOIDECTOMY, AND MAXILLARY ANTROSTOMY   Dillard Cannon, MD 01/25/2019 2:01 PM

## 2019-01-28 MED ORDER — DEXTROSE 5 % IV SOLN
3.0000 g | INTRAVENOUS | Status: AC
  Administered 2019-01-29: 3 g via INTRAVENOUS
  Filled 2019-01-28: qty 3

## 2019-01-29 ENCOUNTER — Other Ambulatory Visit: Payer: Self-pay

## 2019-01-29 ENCOUNTER — Observation Stay (HOSPITAL_COMMUNITY)
Admission: RE | Admit: 2019-01-29 | Discharge: 2019-01-30 | Disposition: A | Discharge status: Home / Self Care | Payer: 59 | Attending: Otolaryngology | Admitting: Otolaryngology

## 2019-01-29 ENCOUNTER — Ambulatory Visit (HOSPITAL_COMMUNITY): Payer: 59 | Admitting: Physician Assistant

## 2019-01-29 ENCOUNTER — Encounter (HOSPITAL_COMMUNITY): Payer: Self-pay | Admitting: Anesthesiology

## 2019-01-29 ENCOUNTER — Encounter (HOSPITAL_COMMUNITY)
Admission: RE | Disposition: A | Discharge status: Home / Self Care | Payer: Self-pay | Source: Home / Self Care | Attending: Otolaryngology

## 2019-01-29 ENCOUNTER — Ambulatory Visit (HOSPITAL_COMMUNITY): Payer: 59 | Admitting: Certified Registered"

## 2019-01-29 DIAGNOSIS — J322 Chronic ethmoidal sinusitis: Secondary | ICD-10-CM | POA: Insufficient documentation

## 2019-01-29 DIAGNOSIS — Z6841 Body Mass Index (BMI) 40.0 and over, adult: Secondary | ICD-10-CM | POA: Diagnosis not present

## 2019-01-29 DIAGNOSIS — J300 Vasomotor rhinitis: Secondary | ICD-10-CM | POA: Diagnosis not present

## 2019-01-29 DIAGNOSIS — J342 Deviated nasal septum: Secondary | ICD-10-CM | POA: Diagnosis not present

## 2019-01-29 DIAGNOSIS — J320 Chronic maxillary sinusitis: Secondary | ICD-10-CM | POA: Diagnosis not present

## 2019-01-29 DIAGNOSIS — J343 Hypertrophy of nasal turbinates: Secondary | ICD-10-CM | POA: Insufficient documentation

## 2019-01-29 DIAGNOSIS — Z7951 Long term (current) use of inhaled steroids: Secondary | ICD-10-CM | POA: Insufficient documentation

## 2019-01-29 DIAGNOSIS — G4733 Obstructive sleep apnea (adult) (pediatric): Secondary | ICD-10-CM | POA: Diagnosis not present

## 2019-01-29 DIAGNOSIS — Z79899 Other long term (current) drug therapy: Secondary | ICD-10-CM | POA: Insufficient documentation

## 2019-01-29 DIAGNOSIS — J330 Polyp of nasal cavity: Secondary | ICD-10-CM | POA: Diagnosis not present

## 2019-01-29 DIAGNOSIS — J3489 Other specified disorders of nose and nasal sinuses: Secondary | ICD-10-CM | POA: Diagnosis not present

## 2019-01-29 DIAGNOSIS — J339 Nasal polyp, unspecified: Secondary | ICD-10-CM | POA: Diagnosis present

## 2019-01-29 HISTORY — PX: NASAL SINUS SURGERY: SHX719

## 2019-01-29 HISTORY — PX: NASAL SEPTOPLASTY W/ TURBINOPLASTY: SHX2070

## 2019-01-29 HISTORY — PX: POLYPECTOMY: SHX149

## 2019-01-29 SURGERY — SEPTOPLASTY, NOSE, WITH NASAL TURBINATE REDUCTION
Anesthesia: General | Site: Nose | Laterality: Right

## 2019-01-29 MED ORDER — HYDROCODONE-ACETAMINOPHEN 5-325 MG PO TABS
1.0000 | ORAL_TABLET | ORAL | Status: DC | PRN
  Administered 2019-01-30: 1 via ORAL
  Filled 2019-01-29: qty 1

## 2019-01-29 MED ORDER — ACETAMINOPHEN 10 MG/ML IV SOLN
INTRAVENOUS | Status: AC
  Filled 2019-01-29: qty 100

## 2019-01-29 MED ORDER — CHLORHEXIDINE GLUCONATE CLOTH 2 % EX PADS: 6.0000 | MEDICATED_PAD | Freq: Once | CUTANEOUS | Status: DC

## 2019-01-29 MED ORDER — ONDANSETRON HCL 4 MG/2ML IJ SOLN: 4.0000 mg | INTRAMUSCULAR | Status: DC | PRN

## 2019-01-29 MED ORDER — LIDOCAINE 2% (20 MG/ML) 5 ML SYRINGE
INTRAMUSCULAR | Status: AC
  Filled 2019-01-29: qty 5

## 2019-01-29 MED ORDER — MUPIROCIN 2 % EX OINT
TOPICAL_OINTMENT | CUTANEOUS | Status: DC | PRN
  Administered 2019-01-29: 1 via NASAL

## 2019-01-29 MED ORDER — ONDANSETRON HCL 4 MG/2ML IJ SOLN
INTRAMUSCULAR | Status: DC | PRN
  Administered 2019-01-29: 4 mg via INTRAVENOUS

## 2019-01-29 MED ORDER — PROMETHAZINE HCL 25 MG/ML IJ SOLN: 6.2500 mg | INTRAMUSCULAR | Status: DC | PRN

## 2019-01-29 MED ORDER — SUGAMMADEX SODIUM 500 MG/5ML IV SOLN
INTRAVENOUS | Status: AC
  Filled 2019-01-29: qty 5

## 2019-01-29 MED ORDER — SUCCINYLCHOLINE CHLORIDE 20 MG/ML IJ SOLN
INTRAMUSCULAR | Status: DC | PRN
  Administered 2019-01-29: 70 mg via INTRAVENOUS

## 2019-01-29 MED ORDER — MIDAZOLAM HCL 2 MG/2ML IJ SOLN
INTRAMUSCULAR | Status: AC
  Filled 2019-01-29: qty 2

## 2019-01-29 MED ORDER — KCL IN DEXTROSE-NACL 20-5-0.45 MEQ/L-%-% IV SOLN
INTRAVENOUS | Status: DC
  Administered 2019-01-29: 17:00:00 via INTRAVENOUS
  Filled 2019-01-29: qty 1000

## 2019-01-29 MED ORDER — OXYCODONE HCL 5 MG/5ML PO SOLN: 5.0000 mg | Freq: Once | ORAL | Status: AC | PRN

## 2019-01-29 MED ORDER — SUCCINYLCHOLINE CHLORIDE 200 MG/10ML IV SOSY
PREFILLED_SYRINGE | INTRAVENOUS | Status: AC
  Filled 2019-01-29: qty 10

## 2019-01-29 MED ORDER — DEXAMETHASONE SODIUM PHOSPHATE 10 MG/ML IJ SOLN
INTRAMUSCULAR | Status: AC
  Filled 2019-01-29: qty 1

## 2019-01-29 MED ORDER — DEXAMETHASONE SODIUM PHOSPHATE 10 MG/ML IJ SOLN
INTRAMUSCULAR | Status: DC | PRN
  Administered 2019-01-29: 10 mg via INTRAVENOUS

## 2019-01-29 MED ORDER — OXYCODONE HCL 5 MG PO TABS
5.0000 mg | ORAL_TABLET | Freq: Once | ORAL | Status: AC | PRN
  Administered 2019-01-29: 5 mg via ORAL

## 2019-01-29 MED ORDER — ONDANSETRON HCL 4 MG PO TABS: 4.0000 mg | ORAL_TABLET | ORAL | Status: DC | PRN

## 2019-01-29 MED ORDER — ONDANSETRON HCL 4 MG/2ML IJ SOLN
INTRAMUSCULAR | Status: AC
  Filled 2019-01-29: qty 2

## 2019-01-29 MED ORDER — OXYCODONE HCL 5 MG PO TABS
ORAL_TABLET | ORAL | Status: AC
  Filled 2019-01-29: qty 1

## 2019-01-29 MED ORDER — ROCURONIUM BROMIDE 50 MG/5ML IV SOSY
PREFILLED_SYRINGE | INTRAVENOUS | Status: AC
  Filled 2019-01-29: qty 5

## 2019-01-29 MED ORDER — PROPOFOL 10 MG/ML IV BOLUS
INTRAVENOUS | Status: DC | PRN
  Administered 2019-01-29: 100 mg via INTRAVENOUS
  Administered 2019-01-29: 130 mg via INTRAVENOUS
  Administered 2019-01-29: 70 mg via INTRAVENOUS

## 2019-01-29 MED ORDER — HYDROMORPHONE HCL 1 MG/ML IJ SOLN: 0.2500 mg | INTRAMUSCULAR | Status: DC | PRN

## 2019-01-29 MED ORDER — MUPIROCIN 2 % EX OINT
TOPICAL_OINTMENT | CUTANEOUS | Status: AC
  Filled 2019-01-29: qty 22

## 2019-01-29 MED ORDER — FENTANYL CITRATE (PF) 250 MCG/5ML IJ SOLN
INTRAMUSCULAR | Status: AC
  Filled 2019-01-29: qty 5

## 2019-01-29 MED ORDER — LIDOCAINE-EPINEPHRINE 2 %-1:100000 IJ SOLN
INTRAMUSCULAR | Status: DC | PRN
  Administered 2019-01-29: 9 mL

## 2019-01-29 MED ORDER — LIDOCAINE 2% (20 MG/ML) 5 ML SYRINGE
INTRAMUSCULAR | Status: DC | PRN
  Administered 2019-01-29: 80 mg via INTRAVENOUS

## 2019-01-29 MED ORDER — IBUPROFEN 100 MG/5ML PO SUSP
400.0000 mg | Freq: Four times a day (QID) | ORAL | Status: DC | PRN
  Administered 2019-01-29 – 2019-01-30 (×2): 400 mg via ORAL
  Filled 2019-01-29 (×3): qty 20

## 2019-01-29 MED ORDER — LIDOCAINE-EPINEPHRINE 2 %-1:100000 IJ SOLN
INTRAMUSCULAR | Status: AC
  Filled 2019-01-29: qty 1

## 2019-01-29 MED ORDER — FENTANYL CITRATE (PF) 100 MCG/2ML IJ SOLN
INTRAMUSCULAR | Status: DC | PRN
  Administered 2019-01-29: 100 ug via INTRAVENOUS
  Administered 2019-01-29: 50 ug via INTRAVENOUS

## 2019-01-29 MED ORDER — LACTATED RINGERS IV SOLN
INTRAVENOUS | Status: DC | PRN
  Administered 2019-01-29 (×2): via INTRAVENOUS

## 2019-01-29 MED ORDER — ACETAMINOPHEN 10 MG/ML IV SOLN
1000.0000 mg | Freq: Once | INTRAVENOUS | Status: DC | PRN
  Administered 2019-01-29: 1000 mg via INTRAVENOUS

## 2019-01-29 MED ORDER — PROPOFOL 10 MG/ML IV BOLUS
INTRAVENOUS | Status: AC
  Filled 2019-01-29: qty 40

## 2019-01-29 MED ORDER — 0.9 % SODIUM CHLORIDE (POUR BTL) OPTIME
TOPICAL | Status: DC | PRN
  Administered 2019-01-29: 1000 mL

## 2019-01-29 MED ORDER — MIDAZOLAM HCL 5 MG/5ML IJ SOLN
INTRAMUSCULAR | Status: DC | PRN
  Administered 2019-01-29: 2 mg via INTRAVENOUS

## 2019-01-29 MED ORDER — OXYMETAZOLINE HCL 0.05 % NA SOLN
NASAL | Status: DC | PRN
  Administered 2019-01-29: 1 via TOPICAL

## 2019-01-29 MED ORDER — ROCURONIUM BROMIDE 50 MG/5ML IV SOSY
PREFILLED_SYRINGE | INTRAVENOUS | Status: DC | PRN
  Administered 2019-01-29: 50 mg via INTRAVENOUS

## 2019-01-29 MED ORDER — CEFAZOLIN SODIUM-DEXTROSE 1-4 GM/50ML-% IV SOLN
1.0000 g | Freq: Three times a day (TID) | INTRAVENOUS | Status: DC
  Administered 2019-01-29 – 2019-01-30 (×2): 1 g via INTRAVENOUS
  Filled 2019-01-29 (×4): qty 50

## 2019-01-29 MED ORDER — OXYMETAZOLINE HCL 0.05 % NA SOLN
NASAL | Status: AC
  Filled 2019-01-29: qty 30

## 2019-01-29 MED ORDER — PHENYLEPHRINE 40 MCG/ML (10ML) SYRINGE FOR IV PUSH (FOR BLOOD PRESSURE SUPPORT)
PREFILLED_SYRINGE | INTRAVENOUS | Status: AC
  Filled 2019-01-29: qty 10

## 2019-01-29 MED ORDER — PHENYLEPHRINE 40 MCG/ML (10ML) SYRINGE FOR IV PUSH (FOR BLOOD PRESSURE SUPPORT)
PREFILLED_SYRINGE | INTRAVENOUS | Status: DC | PRN
  Administered 2019-01-29 (×2): 80 ug via INTRAVENOUS

## 2019-01-29 SURGICAL SUPPLY — 49 items
BLADE INF TURB ROT M4 2 5PK (BLADE) ×4 IMPLANT
BLADE SURG 15 STRL LF DISP TIS (BLADE) ×3 IMPLANT
BLADE SURG 15 STRL SS (BLADE) ×4
CANISTER SUCT 3000ML (MISCELLANEOUS) ×4 IMPLANT
CANISTER SUCT 3000ML PPV (MISCELLANEOUS) ×4 IMPLANT
CLEANER TIP ELECTROSURG 2X2 (MISCELLANEOUS) ×4 IMPLANT
COAGULATOR SUCT 8FR VV (MISCELLANEOUS) ×4 IMPLANT
COVER WAND RF STERILE (DRAPES) ×4 IMPLANT
DRAPE HALF SHEET 40X57 (DRAPES) IMPLANT
DRESSING NASAL KENNEDY 3.5X.9 (MISCELLANEOUS) IMPLANT
DRESSING TELFA 8X10 (GAUZE/BANDAGES/DRESSINGS) ×1 IMPLANT
DRSG NASAL KENNEDY 3.5X.9 (MISCELLANEOUS)
DRSG NASOPORE 8CM (GAUZE/BANDAGES/DRESSINGS) ×1 IMPLANT
DRSG TELFA 3X8 NADH (GAUZE/BANDAGES/DRESSINGS) ×4 IMPLANT
ELECT COATED BLADE 2.86 ST (ELECTRODE) ×4 IMPLANT
ELECT REM PT RETURN 9FT ADLT (ELECTROSURGICAL)
ELECTRODE REM PT RTRN 9FT ADLT (ELECTROSURGICAL) IMPLANT
GAUZE SPONGE 2X2 8PLY STRL LF (GAUZE/BANDAGES/DRESSINGS) IMPLANT
GLOVE SS BIOGEL STRL SZ 7.5 (GLOVE) ×3 IMPLANT
GLOVE SUPERSENSE BIOGEL SZ 7.5 (GLOVE) ×1
GOWN STRL REUS W/ TWL LRG LVL3 (GOWN DISPOSABLE) ×3 IMPLANT
GOWN STRL REUS W/ TWL XL LVL3 (GOWN DISPOSABLE) ×3 IMPLANT
GOWN STRL REUS W/TWL LRG LVL3 (GOWN DISPOSABLE) ×4
GOWN STRL REUS W/TWL XL LVL3 (GOWN DISPOSABLE) ×4
KIT BASIN OR (CUSTOM PROCEDURE TRAY) ×4 IMPLANT
KIT TURNOVER KIT B (KITS) ×4 IMPLANT
NDL HYPO 25GX1X1/2 BEV (NEEDLE) IMPLANT
NDL PRECISIONGLIDE 27X1.5 (NEEDLE) ×3 IMPLANT
NEEDLE HYPO 25GX1X1/2 BEV (NEEDLE) IMPLANT
NEEDLE PRECISIONGLIDE 27X1.5 (NEEDLE) ×4 IMPLANT
NS IRRIG 1000ML POUR BTL (IV SOLUTION) ×4 IMPLANT
PAD ARMBOARD 7.5X6 YLW CONV (MISCELLANEOUS) ×8 IMPLANT
PAD DRESSING TELFA 3X8 NADH (GAUZE/BANDAGES/DRESSINGS) IMPLANT
PATTIES SURGICAL .5 X3 (DISPOSABLE) ×4 IMPLANT
SOLUTION ANTI FOG 6CC (MISCELLANEOUS) ×1 IMPLANT
SPLINT NASAL DOYLE BI-VL (GAUZE/BANDAGES/DRESSINGS) IMPLANT
SPONGE GAUZE 2X2 STER 10/PKG (GAUZE/BANDAGES/DRESSINGS)
STRIP CLOSURE SKIN 1/2X4 (GAUZE/BANDAGES/DRESSINGS) IMPLANT
SUT CHROMIC 3 0 PS 2 (SUTURE) ×1 IMPLANT
SUT CHROMIC 4 0 PS 2 18 (SUTURE) IMPLANT
SUT CHROMIC 5 0 P 3 (SUTURE) IMPLANT
SUT ETHILON 3 0 PS 1 (SUTURE) IMPLANT
SUT ETHILON 4 0 PS 2 18 (SUTURE) IMPLANT
SUT ETHILON 5 0 P 3 18 (SUTURE)
SUT NYLON ETHILON 5-0 P-3 1X18 (SUTURE) IMPLANT
SUT SILK 2 0 PERMA HAND 18 BK (SUTURE) ×4 IMPLANT
TRAY ENT MC OR (CUSTOM PROCEDURE TRAY) ×4 IMPLANT
TUBE CONNECTING 12X1/4 (SUCTIONS) ×4 IMPLANT
WATER STERILE IRR 1000ML POUR (IV SOLUTION) ×4 IMPLANT

## 2019-01-29 NOTE — Anesthesia Procedure Notes (Addendum)
Procedure Name: Intubation Date/Time: 01/29/2019 8:02 AM Performed by: Fransisca Kaufmann, CRNA Pre-anesthesia Checklist: Patient identified, Emergency Drugs available, Suction available and Patient being monitored Patient Re-evaluated:Patient Re-evaluated prior to induction Oxygen Delivery Method: Circle System Utilized Preoxygenation: Pre-oxygenation with 100% oxygen Induction Type: IV induction Ventilation: Mask ventilation with difficulty and Two handed mask ventilation required Laryngoscope Size: Miller, 3, Glidescope and 4 (attempted x 2 with Hyacinth Meeker, x1 with glidescope) Grade View: Grade IV Tube type: Oral Tube size: 7.5 mm Number of attempts: 1 Airway Equipment and Method: Stylet,  Oral airway,  Rigid stylet and Video-laryngoscopy Placement Confirmation: ETT inserted through vocal cords under direct vision,  positive ETCO2 and breath sounds checked- equal and bilateral Secured at: 23 cm Tube secured with: Tape Dental Injury: Teeth and Oropharynx as per pre-operative assessment  Difficulty Due To: Difficulty was anticipated, Difficult Airway- due to large tongue, Difficult Airway- due to reduced neck mobility, Difficult Airway- due to anterior larynx, Difficult Airway- due to limited oral opening and Difficult Airway- due to dentition Comments: Pt morbidly obese, small mouth opening, crowded, crooked teeth, desaturates rapidly

## 2019-01-29 NOTE — Transfer of Care (Signed)
Immediate Anesthesia Transfer of Care Note  Patient: [name]  Procedure(s) Performed: NASAL SEPTOPLASTY WITH BILATERAL TURBINATE REDUCTION (Bilateral Nose) RIGHT NASAL POLYPECTOMY (Right Nose) ENDOSCOPIC SINUS SURGERY (Bilateral Nose)  Patient Location: PACU  Anesthesia Type:General  Level of Consciousness: awake, alert , oriented and sedated  Airway & Oxygen Therapy: Patient Spontanous Breathing and Patient connected to face mask oxygen  Post-op Assessment: Report given to RN, Post -op Vital signs reviewed and stable and Patient moving all extremities  Post vital signs: Reviewed and stable  Last Vitals:  Vitals Value Taken Time  BP 141/87 01/29/2019 10:37 AM  Temp    Pulse 96 01/29/2019 10:41 AM  Resp 12 01/29/2019 10:41 AM  SpO2 97 % 01/29/2019 10:41 AM  Vitals shown include unvalidated device data.  Last Pain:  Vitals:   01/29/19 0637  TempSrc:   PainSc: 0-No pain         Complications: No apparent anesthesia complications

## 2019-01-29 NOTE — Progress Notes (Signed)
Post Op check AF VSS No airway problems Minimal bleeding  Stable post op  Plan discharge in am after packing removed

## 2019-01-29 NOTE — Brief Op Note (Signed)
01/29/2019  10:24 AM  PATIENT:  [name]  33 y.o. male  PRE-OPERATIVE DIAGNOSIS:  DEVIATED SEPTUM, VASOMOTOR RHINITIS, HYPERTROPHY OF TURBINATE, POLYP NASAL OBSTRUCTION,CHRONIC SINUS DISEASE  POST-OPERATIVE DIAGNOSIS:  DEVIATED SEPTUM, VASOMOTOR RHINITIS, HYPERTROPHY OF TURBINATE, POLYP NASAL OBSTRUCTION, CHRONIC SINUS DISEASE  PROCEDURE:  Procedure(s): NASAL SEPTOPLASTY WITH BILATERAL TURBINATE REDUCTION (Bilateral) RIGHT NASAL POLYPECTOMY (Right) ENDOSCOPIC SINUS SURGERY (Bilateral) FESS with Bilateral anterior ethmoidectomy and bilateral maxillary ostia enlargement  SURGEON:  Surgeon(s) and Role:    Drema Halon, MD - Primary  PHYSICIAN ASSISTANT:   ASSISTANTS: none   ANESTHESIA:   general  EBL:  80 cc   BLOOD ADMINISTERED:none  DRAINS: none   LOCAL MEDICATIONS USED:  XYLOCAINE   SPECIMEN:  No Specimen  DISPOSITION OF SPECIMEN:  N/A  COUNTS:  YES  TOURNIQUET:  * No tourniquets in log *  DICTATION: .Other Dictation: Dictation Number [id number]  PLAN OF CARE: Admit for overnight observation  PATIENT DISPOSITION:  PACU - hemodynamically stable.   Delay start of Pharmacological VTE agent (>24hrs) due to surgical blood loss or risk of bleeding: yes

## 2019-01-29 NOTE — Op Note (Signed)
NAMCardell Peach: Timpone, Uvaldo MEDICAL RECORD [id number] ACCOUNT [id number] DATE OF BIRTH:07-13-1986 FACILITY: [facility] LOCATION: [facility] PHYSICIAN:CHRISTOPHER E. NEWMAN, MD  OPERATIVE REPORT  DATE OF PROCEDURE:  01/29/2019  PREOPERATIVE DIAGNOSIS:  Septal deviation with turbinate hypertrophy and chronic nasal obstruction, right intranasal polyp, mild chronic bilateral sinus disease.  POSTOPERATIVE DIAGNOSIS:  Septal deviation with turbinate hypertrophy and chronic nasal obstruction, right intranasal polyp, mild chronic bilateral sinus disease.  OPERATION PERFORMED:  Septoplasty with bilateral inferior turbinate reductions.  Functional endoscopic sinus surgery with bilateral anterior ethmoidectomy and bilateral maxillary ostium enlargement with removal of polyps.  SURGEON:  Dillard Cannonhristopher Newman, MD  ANESTHESIA:  General endotracheal.  ESTIMATED BLOOD LOSS:  60 mL  COMPLICATIONS:  None.  BRIEF CLINICAL NOTE:  The patient is a 33 year old gentleman with history of obstructive sleep apnea and uses nasal CPAP.  He has had chronic problems with trouble breathing through his nose.  On exam, he has a septal deviation to the left with large  turbinates.  He also has a polyp arising from the right middle meatus area.  He had a CT scan that showed mild sinus disease mostly involving a little bit of the anterior ethmoids and maxillary sinuses bilaterally.  Frontal sinuses and sphenoid sinuses  were clear.  He is taken to the operating room this time for septoplasty, turbinate reductions and functional endoscopic sinus surgery to help improve his nasal airway.  DESCRIPTION OF PROCEDURE:  After adequate endotracheal anesthesia, the nose was prepped with Betadine solution and draped off in sterile towels.  The nose was then further prepped with cotton pledgets soaked in Afrin and the septum and middle meatus area  was injected with Xylocaine with epinephrine for hemostasis.  On exam, the patient has a fairly  severe septal deviation to the left, making visualization of the left middle meatus very difficult.  The patient also had a very large polyp arising from the  right middle turbinate that was enlarged.  Polypoid mass was removed and some of the middle turbinate bone was removed with Tru-Cut forceps.  Next, a hemitransfixion incision was made along the cartilage of the septum on the right side.   Mucoperichondrial and Mucoperiosteal flaps were elevated more on the left side of the septum.  Approximately 2 cm posterior just anterior to the bony septum a vertical incision was made and mucoperichondrial and mucoperiosteal flaps were elevated on  either side posterior to this.  The bony and cartilaginous septum that deviated significantly to the left was removed with Jansen-Middleton forceps.  In addition, a large posterior left septal spur was removed.  This completed the septoplasty portion of  procedure.  Next, using the endoscopes, the anterior ethmoidectomy was performed bilaterally using a sickle knife.  Uncinate process was incised.  Anterior ethmoid cells were opened up with straight through-cut forceps and then the microdebrider was used  to remove some additional adenoid anterior ethmoid region.  Maxillary ostia was identified and was enlarged with backbiting and straight through cup forceps.  This was performed bilaterally.  Hemostasis was obtained with suction cautery.  Following  this, inferior turbinate reductions were performed.  On the right side, a Medtronic turbinate blade was used to do submucosal reduction and the turbinate was outfractured and hemostasis obtained with cautery on the left side.  A Medtronic turbinate blade  was used to again do submucosal reduction of the turbinate.  In addition, because of the prominent on the left inferior turbinate, a portion of the left anterior inferior turbinate was amputated with  scissors.  The turbinates were outfractured.  Middle  meatus area was  packed with Nasopore soaked in mupirocin 2% ointment.  Nose was packed with Telfa soaked in mupirocin bilaterally.  This completed the procedure.  The patient was awoken from anesthesia and transferred to recovery and postoperatively  doing well.  DISPOSITION:  The patient will be admitted for overnight observation because of history of obstructive sleep apnea.  We will plan on removing his nasal packs tomorrow morning and discharging him home.  TN/NUANCE  D:01/29/2019 T:01/29/2019 JOB:[id number]

## 2019-01-29 NOTE — Interval H&P Note (Signed)
History and Physical Interval Note:  01/29/2019 7:29 AM  [name]  has presented today for surgery, with the diagnosis of DEVIATED SEPTUM,VASOMOTOR RHINITIS,HYPERTROPHY OF TURBINATE,POLYP NASAL OBSTRUCTION  The various methods of treatment have been discussed with the patient and family. After consideration of risks, benefits and other options for treatment, the patient has consented to  Procedure(s): NASAL SEPTOPLASTY WITH BILATERAL TURBINATE REDUCTION (Bilateral) SEPTOPLASTY WITH ETHMOIDECTOMY, AND MAXILLARY ANTROSTOMY (Bilateral) as a surgical intervention .  The patient's history has been reviewed, patient examined, no change in status, stable for surgery.  I have reviewed the patient's chart and labs.  Questions were answered to the patient's satisfaction.     Dillard Cannon

## 2019-01-29 NOTE — Progress Notes (Signed)
Patient transferred to 6N08, friend at bedside, call bell in reach, bed in lowest position, VS stable, receiving RN at bedside, no questions from patient or RN.  Hermina Barters, RN

## 2019-01-29 NOTE — Brief Op Note (Signed)
01/29/2019  5:12 PM  PATIENT:  [name]  33 y.o. male  PRE-OPERATIVE DIAGNOSIS:  DEVIATED SEPTUM, VASOMOTOR RHINITIS, HYPERTROPHY OF TURBINATE, POLYP NASAL OBSTRUCTION  POST-OPERATIVE DIAGNOSIS:  DEVIATED SEPTUM, VASOMOTOR RHINITIS, HYPERTROPHY OF TURBINATE, POLYP NASAL OBSTRUCTION  PROCEDURE:  Procedure(s): NASAL SEPTOPLASTY WITH BILATERAL TURBINATE REDUCTION (Bilateral) RIGHT NASAL POLYPECTOMY (Right) ENDOSCOPIC SINUS SURGERY (Bilateral)  SURGEON:  Surgeon(s) and Role:    Drema Halon, MD - Primary  PHYSICIAN ASSISTANT:   ASSISTANTS: none   ANESTHESIA:   general  EBL:  10 mL   BLOOD ADMINISTERED:none  DRAINS: none   LOCAL MEDICATIONS USED:  XYLOCAINE   SPECIMEN:  No Specimen  DISPOSITION OF SPECIMEN:  N/A  COUNTS:  YES  TOURNIQUET:  * No tourniquets in log *  DICTATION: .Other Dictation: Dictation Number [id number]  PLAN OF CARE: Admit for overnight observation  PATIENT DISPOSITION:  PACU - hemodynamically stable.   Delay start of Pharmacological VTE agent (>24hrs) due to surgical blood loss or risk of bleeding: yes

## 2019-01-30 DIAGNOSIS — J330 Polyp of nasal cavity: Secondary | ICD-10-CM | POA: Diagnosis not present

## 2019-01-30 MED ORDER — CEPHALEXIN 500 MG PO CAPS: 500.0000 mg | ORAL_CAPSULE | Freq: Two times a day (BID) | ORAL | 0 refills | Status: AC

## 2019-01-30 MED ORDER — HYDROCODONE-ACETAMINOPHEN 5-325 MG PO TABS: 1.0000 | ORAL_TABLET | Freq: Four times a day (QID) | ORAL | 0 refills | Status: DC | PRN

## 2019-01-30 NOTE — Discharge Summary (Signed)
NAMCardell Peach: Michelle, Ty MEDICAL RECORD ZO:[id number] ACCOUNT [id number] DATE OF BIRTH:November 17, 1986 FACILITY: [facility] LOCATION: [facility] PHYSICIAN:Halyn Flaugher Braxton FeathersE. Orlean Holtrop, MD  DISCHARGE SUMMARY  DATE OF DISCHARGE:  01/30/2019  PREOPERATIVE DIAGNOSES: 1.  Obstructive sleep apnea. 2.  Chronic nasal obstruction with a septal deviation, intranasal polyps with mild sinus disease.  OPERATIONS DURING THIS HOSPITALIZATION:  A septoplasty with bilateral inferior turbinate reductions and limited functional endoscopic sinus surgery with removal of sinonasal polyps on 01/29/2019.  HOSPITAL COURSE:  The patient was admitted via the operating room on 01/29/2019 following septoplasty, turbinate reductions, and sinus surgery because of history of obstructive sleep apnea.  The patient's nose was packed overnight.  He had no significant  respiratory problems with monitoring of O2 sats.  He had minimal bleeding.  He remained afebrile with vital signs stable.  He received perioperative Ancef IV.  His nasal packing was removed on his 1st postoperative day, and the patient was subsequently  discharged home.  He can resume his CPAP when he goes home.  DISCHARGE MEDICATIONS:  Include his normal medications along with Keflex 500 mg b.i.d. for 1 week.  Tylenol, ibuprofen or hydrocodone 5 mg tablets 1-2 q.6 hours p.r.n. pain.  FOLLOWUP:  He will follow up in my office in 5 days for recheck.  FINAL DIAGNOSES: 1.  Obstructive sleep apnea. 2.  Chronic nasal obstruction with deviated septum, turbinate hypertrophy, and chronic sinus disease with intranasal polyps.  LN/NUANCE D:01/30/2019 T:01/30/2019 JOB:[id number]

## 2019-01-30 NOTE — Progress Notes (Signed)
POD 1 AF VSS Minimal bleeding No airway problems Nasal packing removed Discharge home with follow up in 5 days Meds: Keflex and Hydrocodone prn pain Dictated discharge  [phone]

## 2019-01-30 NOTE — Discharge Instructions (Signed)
Keflex 500 mg twice per day for the next week. Tylenol, ibuprofen or hydrocodone 5 mg tabs 1-2 every 6 hrs prn more severe pain Use saline nasal rinse 2-3 times per day Call Dr Ezzard Standing if you have any questions  [phone] Return to see Dr Ezzard Standing next Thursday at 4:30 at his office

## 2019-01-30 NOTE — Progress Notes (Signed)
Didchsrge home. ahome discharge instruction given, no question verbalized.

## 2019-02-01 ENCOUNTER — Encounter (HOSPITAL_COMMUNITY): Payer: Self-pay | Admitting: Otolaryngology

## 2019-02-09 NOTE — Anesthesia Postprocedure Evaluation (Signed)
Anesthesia Post Note  Patient: Amarri Choice  Procedure(s) Performed: NASAL SEPTOPLASTY WITH BILATERAL TURBINATE REDUCTION (Bilateral Nose) RIGHT NASAL POLYPECTOMY (Right Nose) ENDOSCOPIC SINUS SURGERY (Bilateral Nose)     Patient location during evaluation: PACU Anesthesia Type: General Level of consciousness: awake and alert Pain management: pain level controlled Vital Signs Assessment: post-procedure vital signs reviewed and stable Respiratory status: spontaneous breathing, nonlabored ventilation, respiratory function stable and patient connected to nasal cannula oxygen Cardiovascular status: blood pressure returned to baseline and stable Postop Assessment: no apparent nausea or vomiting Anesthetic complications: no    Last Vitals:  Vitals:   01/30/19 0541 01/30/19 0940  BP: 132/73 (!) 148/84  Pulse: 84 88  Resp: 17 16  Temp: 36.9 C 36.8 C  SpO2: 99% 99%    Last Pain:  Vitals:   01/30/19 0940  TempSrc: Oral  PainSc:                  Paiden Caraveo S

## 2019-09-21 IMAGING — CT CT MAXILLOFACIAL W/O CM
1 series · 15 of 30 positions shown, 19 images · non-contrast
Comparison: None.

CLINICAL DATA: 32 y/o M; vasomotor rhinitis. Nasal polyp cauterized
1 week ago. Congestion. Runny nose.

EXAM:
CT MAXILLOFACIAL WITHOUT CONTRAST
TECHNIQUE: Multidetector CT images of the paranasal sinuses were obtained using
the standard protocol without intravenous contrast.

[Series 6: soft tissue · axial · 0.47mm/px · z∈[-216,-36]mm · 15 of 194 slices shown, 19 images]
[im 7/194  brain]
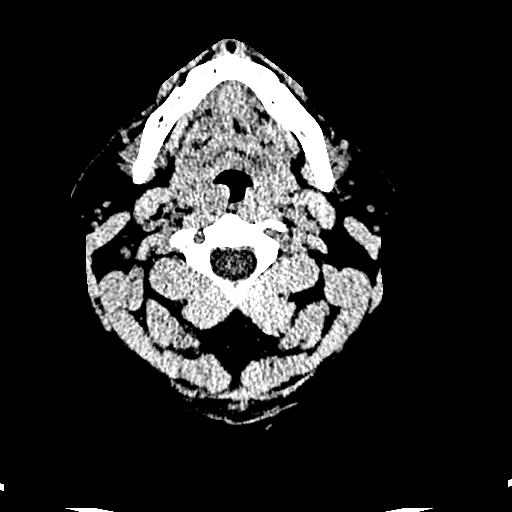
[im 7/194  bone]
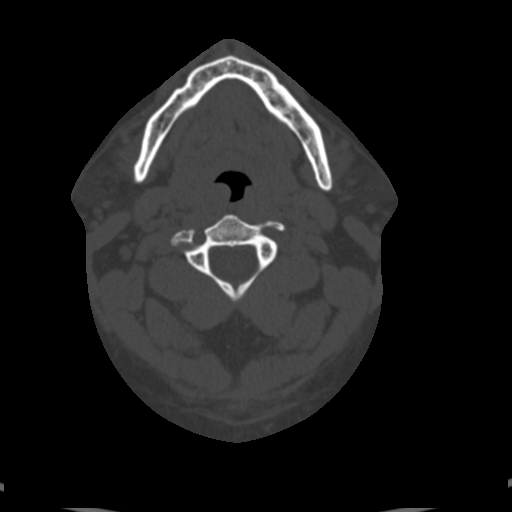
[im 20/194  bone]
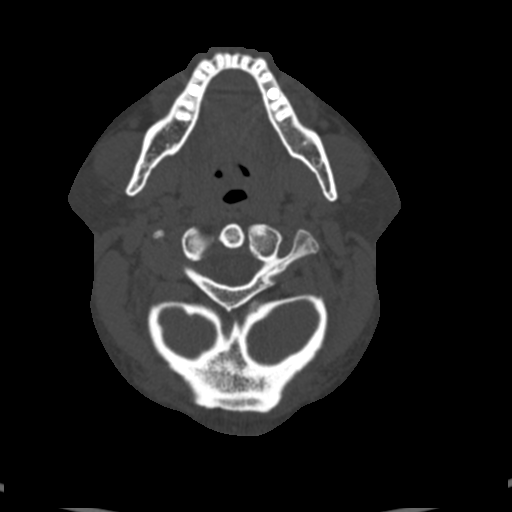
[im 34/194  bone]
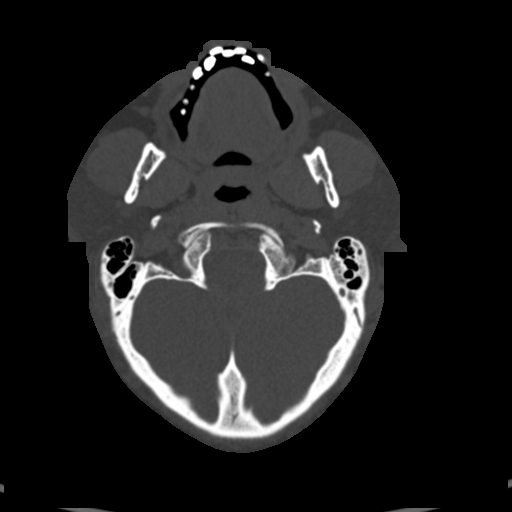
[im 47/194  bone]
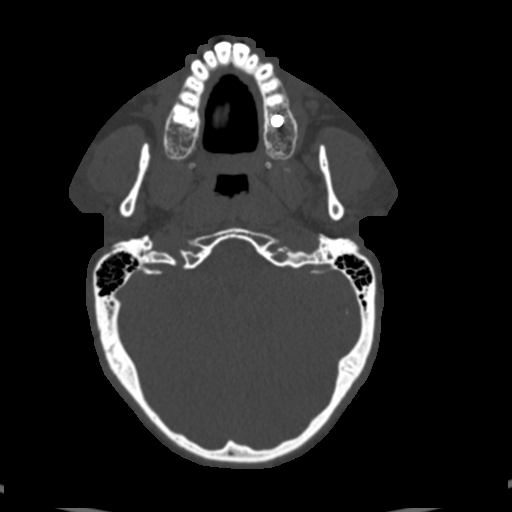
[im 60/194  brain]
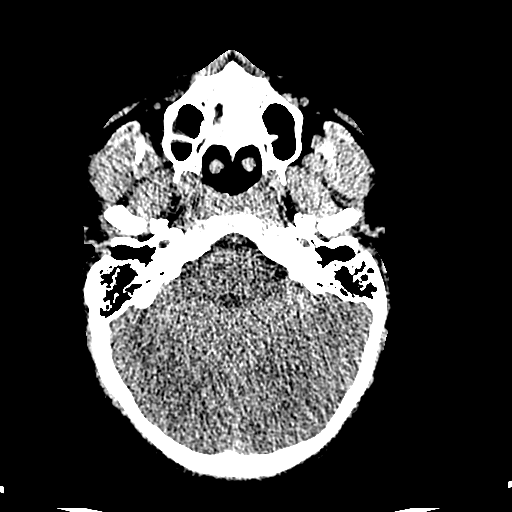
[im 60/194  bone]
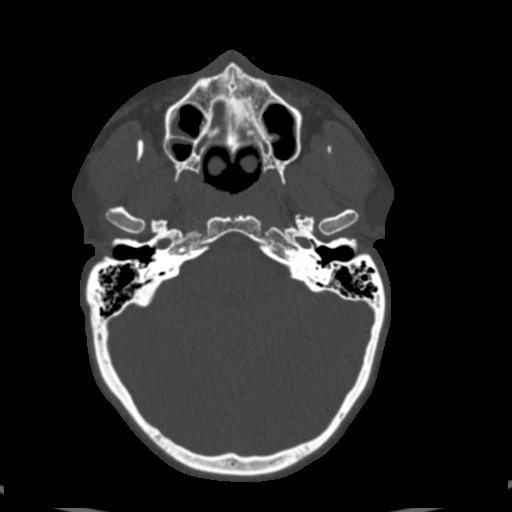
[im 74/194  bone]
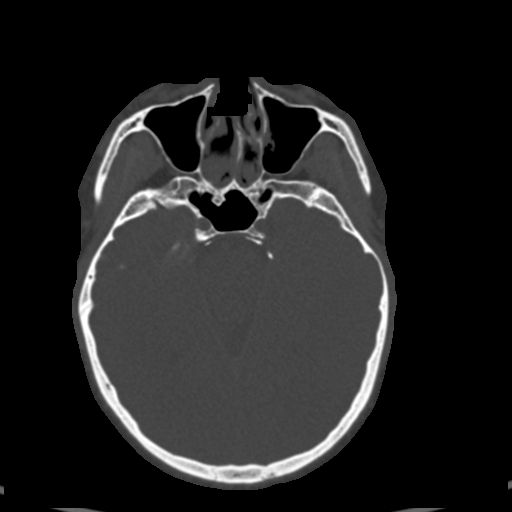
[im 87/194  bone]
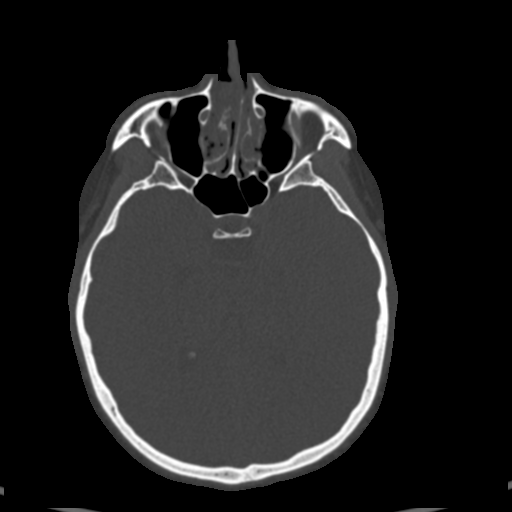
[im 100/194  bone]
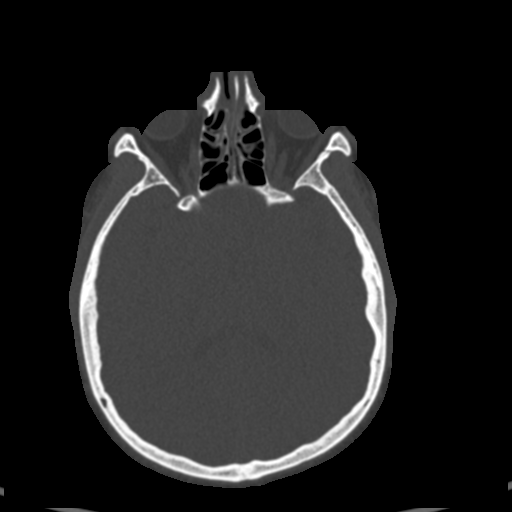
[im 107/194  brain]
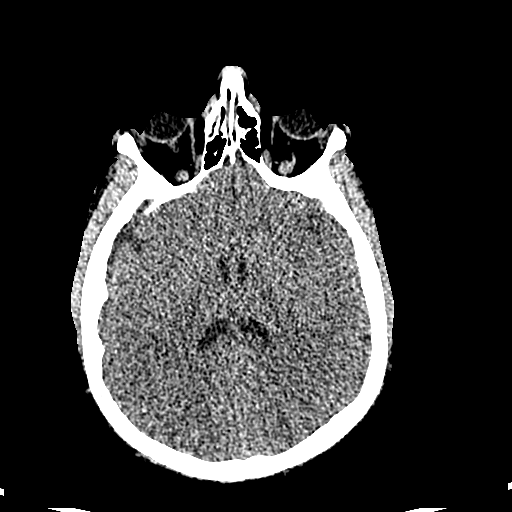
[im 107/194  bone]
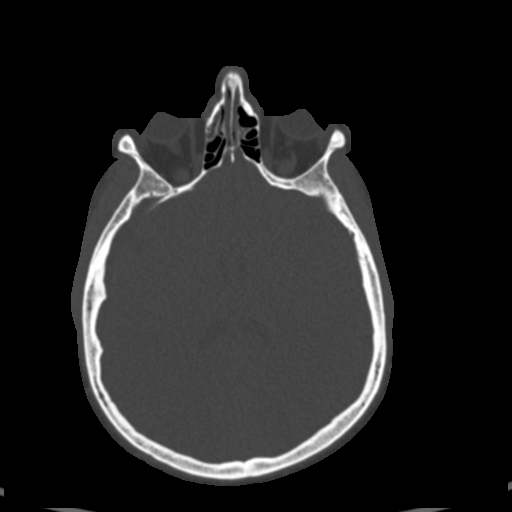
[im 120/194  bone]
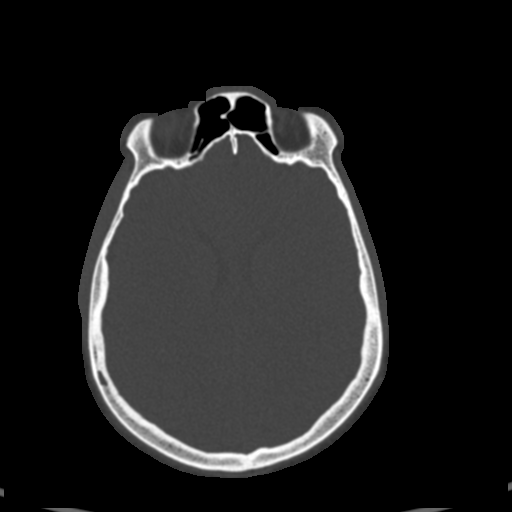
[im 134/194  bone]
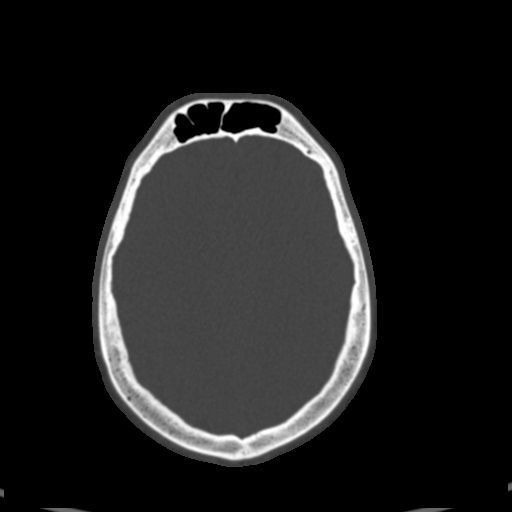
[im 147/194  bone]
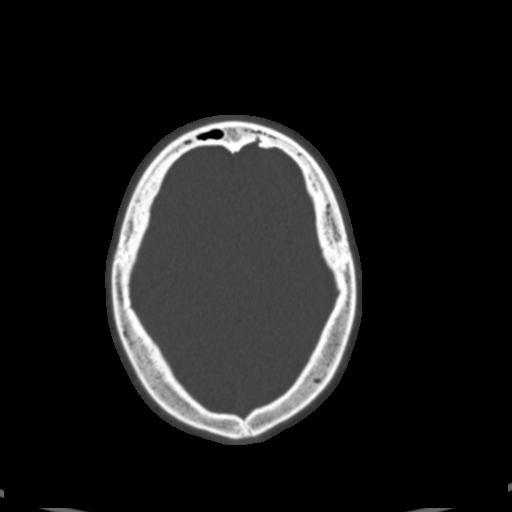
[im 160/194  brain]
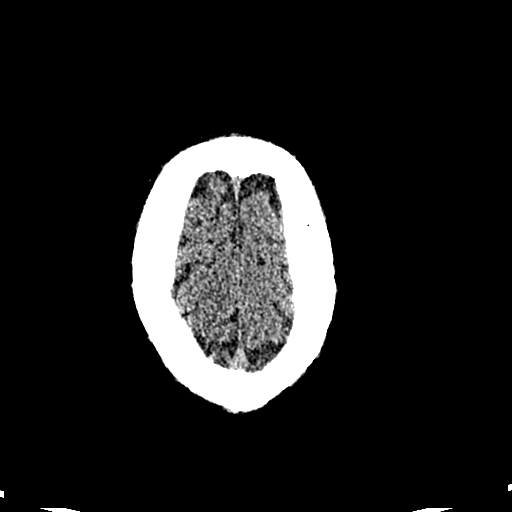
[im 160/194  bone]
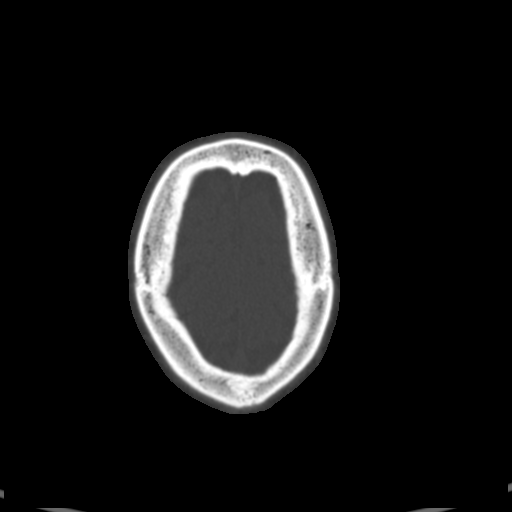
[im 174/194  bone]
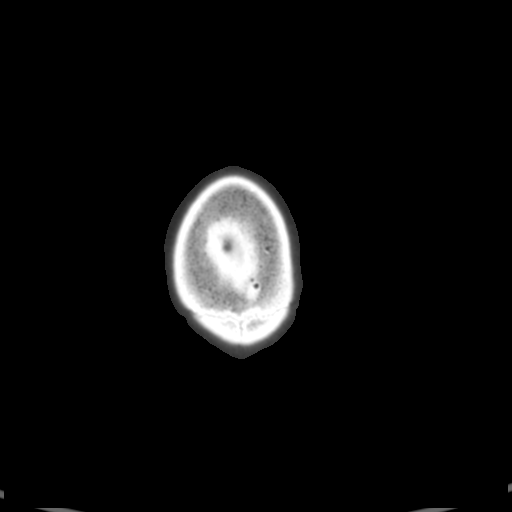
[im 187/194  bone]
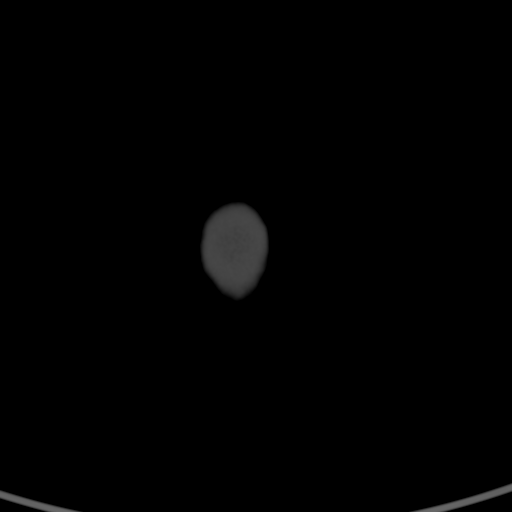

[15 of 30 positions shown; findings below may reference images not displayed]

FINDINGS: Paranasal sinuses:

Frontal: Normally aerated. Partial opacification of frontal sinus
drainage pathways, greater on the left.

Ethmoid: Mild mucosal thickening of the ethmoid air cells, anterior
greater than posterior.

Maxillary: Mild mucosal thickening of the maxillary sinuses
bilaterally. Aerosolized secretions within the left maxillary sinus.
Small mucous retention cyst within the right maxillary alveolar
recess.

Sphenoid: Tiny right-sided mucous retention cyst. Otherwise normally
aerated. Patent sphenoethmoidal recesses.

Right ostiomeatal unit: Mucosal thickening of the patent
infundibulum. Opacified semilunar hiatus and middle meatus.

Left ostiomeatal unit: Opacified.

Nasal passages: Right anterior nasal passage polypoid lesion
measuring approximately 19 x 10 x 23 mm (AP x ML x CC series 3,
image 86 and series 4, image 27). Mild leftward nasal septal
deviation with 5 mm leftward directed nasal spur. Mucosal thickening
and debris partially opacifying the nasal passages. Intact nasal
septum.

Anatomy: Pneumatization superior to anterior ethmoid notches. Intact
olfactory grooves and fovea ethmoidalis, right fovea ethmoidalis 5
mm higher than left and right olfactory groove 1 mm higher than
left, right Keros III and left Keros II. Sellar sphenoid
pneumatization pattern. No dehiscence of carotid or optic canals.

Other: Orbits and intracranial compartment are unremarkable. Visible
mastoid air cells are normally aerated.
IMPRESSION: 1. Right anterior nasal passage polypoid lesion measuring up to 23
mm.
2. Mild diffuse paranasal sinus mucosal thickening with relative
sparing of frontal and sphenoid sinuses.
3. Opacification of frontal sinus drainage pathways and ostiomeatal
units, greater on the left.

## 2019-11-01 ENCOUNTER — Encounter: Payer: Self-pay | Admitting: Neurology

## 2019-11-01 ENCOUNTER — Ambulatory Visit: Payer: 59 | Admitting: Neurology

## 2019-11-01 ENCOUNTER — Other Ambulatory Visit: Payer: Self-pay

## 2019-11-01 VITALS — BP 152/94 | HR 74 | Temp 98.6°F | Ht 67.0 in | Wt 286.0 lb

## 2019-11-01 DIAGNOSIS — G4733 Obstructive sleep apnea (adult) (pediatric): Secondary | ICD-10-CM

## 2019-11-01 DIAGNOSIS — G4734 Idiopathic sleep related nonobstructive alveolar hypoventilation: Secondary | ICD-10-CM | POA: Diagnosis not present

## 2019-11-01 DIAGNOSIS — J3081 Allergic rhinitis due to animal (cat) (dog) hair and dander: Secondary | ICD-10-CM | POA: Diagnosis not present

## 2019-11-01 DIAGNOSIS — Z9989 Dependence on other enabling machines and devices: Secondary | ICD-10-CM

## 2019-11-01 DIAGNOSIS — Z6841 Body Mass Index (BMI) 40.0 and over, adult: Secondary | ICD-10-CM

## 2019-11-01 NOTE — Patient Instructions (Signed)

## 2019-11-01 NOTE — Progress Notes (Signed)
SLEEP MEDICINE CLINIC   Provider:  Melvyn Novas, M D  Primary Care Physician:  Patient, No Pcp Per   Referring Provider: Magdalene River, PA*  Chief Complaint  Patient presents with  . Follow-up    pt alone, rm 10. DME [employer]. states things are well.     HPI:  [name] is a 33 y.o. male , seen here  in a referral  from PA. Barnett Abu for an evaluation of sleep disordered breathing.  17 December 2017, Caucasian 33 year old married gentleman, right-handed who presents with a complaint of waking up gasping for air, his wife has witnessed him to have apnea and snoring.  He also feels that his sleep no longer has the same restorative and refreshing quality it used to have.  He wakes up up to 4 times at night not always sure what wakes him usually being able to go back to sleep he has on occasion fall asleep while sitting watching TV, and has fallen asleep while driving 2 month ago.   Sleep habits are as follows: The patient usually aims for a bedtime of 10 PM, while he is trying to go to sleep his wife does watch TV in the marital bedroom.  He himself prefers to watch TV in the den before he goes to bed.  He estimates 15 minutes of sleep latency, and then usually can sleep for several hours before waking up for the first time.  Aside from the running tv his bedroom, the bedroom is usually cool, quiet and dark.  The patient states that he goes more often to the bathroom when he actually has the urge to urinate, it is that he awakens for other reasons and then uses that interruption in sleep for a bathroom break. He wakes up with a very dry mouth, often times with diaphoresis, not palpitations.  He usually does not have headaches, dizziness or nausea. He estimates 7-8 hours of nocturnal sleep. Rises at 6.30 AM and starts working from home by 7.30 AM. His employer is in [place].   Sleep medical history and family sleep history:   The patient does not recollect having any childhood  sleep disorders such as night terrors, enuresis or sleep walking.  Did not  undergo tonsillectomy. NO history of TBI or neck injury. No hospitalizations.  No Asthma , but sister is affected.   Social history: Patient is able to work from home, is a Geographical information systems officer.  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  His non-smoker, he drinks socially alcohol-3 drinks a month, he does consume caffeine.  He drinks hot tea in the morning, and disorders in the afternoon.  Around 5 caffeinated beverages a day he is not a shift Financial controller. Married, no children, one dog.    PSG SLIT study January 2019;RESPIRATORY ANALYSIS:  There were a total of 127 respiratory events:  101 obstructive apneas, 26 hypopneas without additional respiratory event related arousals (RERAs). Loud Snoring was noted.    The total APNEA/HYPOPNEA INDEX (AHI) was 83.3 /hour and the total RESPIRATORY DISTURBANCE INDEX was 83.3 /hour.  0 events occurred in REM sleep and 52 events in NREM. The REM AHI was 0, /hour versus a non-REM AHI of 83.3 /hour. The patient spent 315.5 minutes of sleep time in the supine position, and 138 minutes in non-supine. The supine AHI was 83.2 /hour versus a non-supine AHI of 0.0 /hour.  OXYGEN SATURATION & C02:  The wake baseline 02 saturation was 96%,  with the lowest being 69%. Time spent below 89% saturation equaled 81 minutes.  CPAP was initiated and  Oxygen saturation corrected.   RV 11-01-2019, [name], V Vrishank is a 33 year old caucasian male patient  Who reports his CPAP changed his life, beginning after one week of CPAP therapy he felt no longer sleepy when driving, he is no longer fatigued. [name] reports that since I saw him last he has gone through a divorce, his job has changed so many during Covid and he is mainly working from home and expects to do so until the end of the calendar year.   He is using an a Statisticianflex Respironics machine and the 30-day download ending on 4 November  of this year shows 100% compliance each night over 4 hours, with an average use at time of 7 hours 46minutes mean pressure of 11 cmH2O and average pressure of 12.7 cmH2O and a resulting residual AHI of 2.6/h.  This is an excellent result he does not have significant air leakage the minimum pressure is 8 maximum pressure 13 cmH2O with an a flex setting of sleep.    Review of Systems: Out of a complete 14 system review, the patient complains of only the following symptoms, and all other reviewed systems are negative. Obesity, snoring, apnea witnessed,  He endorsed the Epworth sleepiness score at only 3 out of 24 points.   Pre CPAP Epworth score 10/ 24  , Fatigue severity score 22  , depression score n/a -  He has no Nocturia.   Social History   Socioeconomic History  . Marital status: Divorced    Spouse name: Marcelino DusterMichelle  . Number of children: 0  . Years of education: College  . Highest education level: Associate degree: academic program  Occupational History  . Occupation: Art gallery managerData base Admin.     Comment: [employer]  Social Needs  . Financial resource strain: Not hard at all  . Food insecurity    Worry: Never true    Inability: Never true  . Transportation needs    Medical: No    Non-medical: No  Tobacco Use  . Smoking status: Never Smoker  . Smokeless tobacco: Never Used  Substance and Sexual Activity  . Alcohol use: Yes    Comment: occ.  . Drug use: No  . Sexual activity: Yes    Partners: Female    Comment: with monogamous wife  Lifestyle  . Physical activity    Days per week: 3 days    Minutes per session: 60 min  . Stress: Not on file  Relationships  . Social Musicianconnections    Talks on phone: Twice a week    Gets together: Once a week    Attends religious service: Never    Active member of club or organization: No    Attends meetings of clubs or organizations: Never    Relationship status: Married  . Intimate partner violence    Fear of current or ex partner:  No    Emotionally abused: No    Physically abused: No    Forced sexual activity: No  Other Topics Concern  . Not on file  Social History Narrative   Diet: Average American diet: breakfast is cereal or oatmeal, lunch is typical a lean cuisine, dinner is typical spaghetti, meat and sides, tacos, and hamburgers. Eats veggies. Does not eat a lot of fruit. Drinks mostly diet sodas and sweet tea. Trying to drink more water.  Exercise: He is exercising 2-3 days a week. Goes to the park and does brisk walking for about an hour. Trying to increase it to 5 days a week.       Patient is currently going though a divorce    Family History  Problem Relation Age of Onset  . Hypertension Father   . Hyperlipidemia Father   . Stomach cancer Maternal Grandfather   . Colon cancer Paternal Grandfather     Current Outpatient Medications  Medication Sig Dispense Refill  . cetirizine (ZYRTEC) 10 MG tablet Take 10 mg by mouth daily as needed.     . fluticasone (FLONASE) 50 MCG/ACT nasal spray Place 2 sprays into both nostrils daily. (Patient not taking: Reported on 11/01/2019) 16 g 6   No current facility-administered medications for this visit.     Allergies as of 11/01/2019 - Review Complete 11/01/2019  Allergen Reaction Noted  . Vicks vaporub [camph-eucalypt-men-turp-pet] Rash 11/08/2017    Vitals: BP (!) 152/94   Pulse 74   Temp 98.6 F (37 C) (Oral)   Ht 5\' 7"  (1.702 m)   Wt 286 lb (129.7 kg)   BMI 44.79 kg/m  Last Weight:  Wt Readings from Last 1 Encounters:  11/01/19 286 lb (129.7 kg)   13/09/20 mass index is 44.79 kg/m.     Last Height:   Ht Readings from Last 1 Encounters:  11/01/19 5\' 7"  (1.702 m)    Physical exam:  General: The patient is awake, alert and appears not in acute distress. The patient is well groomed. Head: Normocephalic, atraumatic. Neck is supple. Mallampati 5 ,  neck circumference:19. 25 . Nasal airflow patent -  Retrognathia is seen. Small lower jaw.    Used to wear braces.  Cardiovascular:  Regular rate and rhythm.Respiratory: Lungs are clear to auscultation. Skin:  Without evidence of edema, or rash Trunk: BMI is 44. 7 kg/m2. The patient's posture is erect.  Neurologic exam : The patient is awake and alert, oriented to place and time.   Memory subjective described as intact.  Attention span & concentration ability appears normal.  Speech is fluent,  without dysarthria, dysphonia or aphasia.  Mood and affect are appropriate.  Cranial nerves: Pupils are equal and briskly reactive to light. Extraocular movements intact and without nystagmus. Visual fields by finger perimetry are intact.Hearing to finger rub intact. Facial sensation intact to fine touch.Facial motor strength is symmetric and tongue and uvula move midline.  Shoulder shrug was symmetrical.   Motor exam:   Normal tone, muscle bulk and symmetric strength in all extremities. Sensory:  Fine touch, pinprick and vibration were tested in all extremities. Proprioception tested in the upper extremities was normal. Gait and station: Patient walks without assistive device . Deep tendon reflexes: in the  upper and lower extremities are symmetric and intact.    Assessment:  After physical and neurologic examination, review of laboratory studies,  Personal review of imaging studies, reports of other /same  Imaging studies, results of polysomnography and / or neurophysiology testing and pre-existing records as far as provided in visit., my assessment is   1) OSA is severe at baseline and was associated with hypoxemia- CPAP has corrected the AHI.    2) Obesity- BMI 40  weight management - refer to Dr. 13/09/20. I will repeat this offer and referral. .   3) no longer depression.   Rv in 12 month.   , MD  11-01-2019     PATIENT'S NAME:  Melvyn Novas  DOB:      10/08/1986      MR#:    [id number]     DATE OF RECORDING: 01/23/2018 REFERRING M.D.:  Benjiman Core  PA-C Study Performed:  Split-Night Titration Study HISTORY:  [name] is a right handed Caucasian 33 year old married man who presents with a complaint of waking up gasping for air. His wife has witnessed him to have apnea and snoring.  He also feels that his sleep no longer has the same restorative and refreshing quality it used to have.  He wakes 3 to 4 times at night - he has been falling asleep while sitting watching TV, and has fallen asleep while driving 2 month ago. He endorsed respiratory allergies and morbid obesity The patient endorsed the Epworth Sleepiness Scale at 10 points.   The patient's weight 278 pounds with a height of 67 (inches), resulting in a BMI of 43.6 kg/m2. The patient's neck circumference measured 19.3 inches.  CURRENT MEDICATIONS: Zyrtec, Sudafed,   BASELINE STUDY WITHOUT CPAP RESULTS: Lights Out was at 21:05 and Lights On at 05:00.  Total recording time (TRT) was 101.5, with a total sleep time (TST) of 91.5 minutes.   The patient's sleep latency was 3.5 minutes. REM latency was 0 minutes. The sleep efficiency was 90.1 %.    SLEEP ARCHITECTURE: WASO (Wake after sleep onset) was 0.5 minutes, Stage N1 was 5 minutes, Stage N2 was 84.5 minutes, Stage N3 was 2 minutes and Stage R (REM sleep) was 0 minutes.  The percentages were Stage N1 5.5%, Stage N2 92.3%, Stage N3 2.2% and Stage R (REM sleep) 0%.  RESPIRATORY ANALYSIS:  There were a total of 127 respiratory events:  101 obstructive apneas, 26 hypopneas without additional respiratory event related arousals (RERAs). Loud Snoring was noted   The total APNEA/HYPOPNEA INDEX (AHI) was 83.3 /hour and the total RESPIRATORY DISTURBANCE INDEX was 83.3 /hour.  0 events occurred in REM sleep and 52 events in NREM. The REM AHI was 0, /hour versus a non-REM AHI of 83.3 /hour. The patient spent 315.5 minutes of sleep time in the supine position, and 138 minutes in non-supine. The supine AHI was 83.2 /hour versus a non-supine AHI  of 0.0 /hour. OXYGEN SATURATION & C02:  The wake baseline 02 saturation was 96%, with the lowest being 69%. Time spent below 89% saturation equaled 81 minutes.  PERIODIC LIMB MOVEMENTS:   The patient had a total of 0 Periodic Limb Movements.   The arousals were noted as: 1 was spontaneous, 0 were associated with PLMs, and 112 were associated with respiratory events. Audio and video analysis did not show any abnormal or unusual movements, behaviors, phonations or vocalizations. No nocturia. NSR in EKG. Loud Snoring was noted   TITRATION STUDY WITH CPAP RESULTS:   CPAP was initiated at 6 cmH20 with heated humidity per AASM split night standards and pressure was advanced to 10 cmH20 because of hypopneas, apneas and desaturations.  At a PAP pressure of 10 cmH20, there was a sleep efficiency of 99.8%, and reduction of the AHI to 0.0 /hour.   Total recording time (TRT) was 374 minutes, with a total sleep time (TST) of 361.5 minutes. The patient's sleep latency was 14.5 minutes. REM latency was 18.5 minutes.  The sleep efficiency was 96.7 %.    SLEEP ARCHITECTURE: Wake after sleep was 6.5 minutes, Stage N1 7.5 minutes, Stage N2 110.5 minutes, Stage N3 93 minutes and Stage R (REM sleep) 150.5 minutes. The percentages were: Stage N1  2.1%, Stage N2 30.6%, Stage N3 25.7% and Stage R (REM sleep) 41.6%. The sleep architecture was notable for supine REM sleep. RESPIRATORY ANALYSIS:  There were a total of 13 respiratory events: 0 apneas and 13 hypopneas with 0 respiratory event related arousals (RERAs).   The total APNEA/HYPOPNEA INDEX (AHI) was 2.2 /hour and the total RESPIRATORY DISTURBANCE INDEX was 2.2 /hour.  2 events occurred in REM sleep and 11 events in NREM. The REM AHI was 0.8 /hour versus a non-REM AHI of 3.1 /hour. The patient spent 62% of total sleep time in the supine position. The supine AHI was 3.2 /hour, versus a non-supine AHI of 0.4/hour.  OXYGEN SATURATION & C02:  The wake baseline 02  saturation was 98%, with the lowest being 75%. Time spent below 89% saturation equaled 2 minutes.  PERIODIC LIMB MOVEMENTS:    The patient had a total of 0 Periodic Limb Movements. The arousals were noted as: 7 were spontaneous, 0 were associated with PLMs, and 4 were associated with respiratory events.  Post-study, the patient indicated that sleep was better than usual. The patient was fitted with a nasal pillow in small size, a ResMed AirFit P10.   POLYSOMNOGRAPHY IMPRESSION :   1. Severe Obstructive Sleep Apnea (OSA) at AHI of 83.3/hr. SPLIT protocol was initiated after only one hour of baseline study.  2. Severe intermittent hypoxia was associated with apnea, to a nadir of 69%.  3. Loud Primary Snoring. 4. All named conditions improved with CPAP titration.   RECOMMENDATIONS:  1. Advise to start the patient at auto CPAP of 8 through 13 cm H2O. A ResMed AirFit P 10 in small size was used with heated humidity during this study.  Advise to add heated humidity.  Adjust interface and heated humidity as needed.     2. Compliance to PAP-therapy should be emphasized. Compliance, AHI and air leak information to be downloaded for objective assessment at 30 days, 180 days and annually thereafter.   3. Advise to lose weight by diet and exercise if not contraindicated (BMI over 40). Further information regarding OSA may be obtained from [employer] (www.sleepfoundation.org) or American Sleep Apnea Association (www.sleepapnea.org). 4. A follow up appointment will be scheduled in the Sleep Clinic at [facility].      I certify that I have reviewed the entire raw data recording prior to the issuance of this report in accordance with the Standards of Accreditation of the American Academy of Sleep Medicine (AASM)      Larey Seat, M.D.    01-30-2018  Diplomat, American Board of Psychiatry and Neurology  Diplomat, [place] of Sleep Medicine Medical Director,  [facility]

## 2020-06-13 ENCOUNTER — Other Ambulatory Visit: Payer: Self-pay | Admitting: Neurology

## 2020-06-13 DIAGNOSIS — E66813 Obesity, class 3: Secondary | ICD-10-CM

## 2020-06-13 DIAGNOSIS — G4733 Obstructive sleep apnea (adult) (pediatric): Secondary | ICD-10-CM

## 2020-11-01 ENCOUNTER — Ambulatory Visit: Payer: 59 | Admitting: Adult Health

## 2020-11-01 ENCOUNTER — Encounter: Payer: Self-pay | Admitting: Adult Health

## 2020-11-01 VITALS — BP 138/89 | HR 65 | Ht 67.0 in | Wt 303.4 lb

## 2020-11-01 DIAGNOSIS — G4733 Obstructive sleep apnea (adult) (pediatric): Secondary | ICD-10-CM | POA: Diagnosis not present

## 2020-11-01 DIAGNOSIS — Z9989 Dependence on other enabling machines and devices: Secondary | ICD-10-CM

## 2020-11-01 NOTE — Progress Notes (Signed)
PATIENT: [name] DOB: 11-23-1986  REASON FOR VISIT: follow up HISTORY FROM: patient  HISTORY OF PRESENT ILLNESS: Today 11/01/20:  [name] is a 34 year old male with a history of obstructive sleep apnea on CPAP.  He returns today for follow-up.  His download indicates that he uses machine nightly for compliance of 100%.  He uses machine greater than 4 hours each night.  On average he uses it 8 hours and 9 minutes.  His residual AHI is 2.8 on 8 to 13 cm of water.  He reports that the CPAP is working well for him.  He denies any new issues.  He returns today for an evaluation.  HISTORY 11-01-2019, [name], [place] is a 34 year old caucasian male patient  Who reports his CPAP changed his life, beginning after one week of CPAP therapy he felt no longer sleepy when driving, he is no longer fatigued. [name] reports that since I saw him last he has gone through a divorce, his job has changed so many during Covid and he is mainly working from home and expects to do so until the end of the calendar year.   He is using an a Statistician and the 30-day download ending on 4 November of this year shows 100% compliance each night over 4 hours, with an average use at time of 7 hours mean pressure of 11 cmH2O and average pressure of 12.7 cmH2O and a resulting residual AHI of 2.6/h.  This is an excellent result he does not have significant air leakage the minimum pressure is 8 maximum pressure 13 cmH2O with an a flex setting of sleep.  REVIEW OF SYSTEMS: Out of a complete 14 system review of symptoms, the patient complains only of the following symptoms, and all other reviewed systems are negative.  FSS 13 ESS 3  ALLERGIES: Allergies  Allergen Reactions  . Vicks Vaporub [Camph-Eucalypt-Men-Turp-Pet] Rash    HOME MEDICATIONS: Outpatient Medications Prior to Visit  Medication Sig Dispense Refill  . cetirizine (ZYRTEC) 10 MG tablet Take 10 mg by mouth daily as  needed.     . fluticasone (FLONASE) 50 MCG/ACT nasal spray Place 2 sprays into both nostrils daily. (Patient not taking: Reported on 11/01/2019) 16 g 6   No facility-administered medications prior to visit.    PAST MEDICAL HISTORY: Past Medical History:  Diagnosis Date  . History of kidney stones   . OSA on CPAP   . Pericardial cyst    dx from Ct Abdomen 2013 - no follow up    PAST SURGICAL HISTORY: Past Surgical History:  Procedure Laterality Date  . MULTIPLE TOOTH EXTRACTIONS    . NASAL SEPTOPLASTY W/ TURBINOPLASTY Bilateral 01/29/2019   Procedure: NASAL SEPTOPLASTY WITH BILATERAL TURBINATE REDUCTION;  Surgeon: Drema Halon, MD;  Location: [facility] OR;  Service: ENT;  Laterality: Bilateral;  . NASAL SINUS SURGERY Bilateral 01/29/2019   Procedure: ENDOSCOPIC SINUS SURGERY;  Surgeon: Drema Halon, MD;  [facility];  Service: ENT;  Laterality: Bilateral;  . POLYPECTOMY Right 01/29/2019   Procedure: RIGHT NASAL POLYPECTOMY;  Surgeon: Drema Halon, MD;  Location: [facility] OR;  Service: ENT;  Laterality: Right;  . WISDOM TOOTH EXTRACTION      FAMILY HISTORY: Family History  Problem Relation Age of Onset  . Hypertension Father   . Hyperlipidemia Father   . Stomach cancer Maternal Grandfather   . Colon cancer Paternal Grandfather     SOCIAL HISTORY: Social History  Socioeconomic History  . Marital status: Divorced    Spouse name: [name]  . Number of children: 0  . Years of education: College  . Highest education level: Associate degree: academic program  Occupational History  . Occupation: Art gallery manager.     Comment: [employer]  Tobacco Use  . Smoking status: Never Smoker  . Smokeless tobacco: Never Used  Vaping Use  . Vaping Use: Never used  Substance and Sexual Activity  . Alcohol use: Yes    Comment: occ.  . Drug use: No  . Sexual activity: Yes    Partners: Female    Comment: with monogamous wife  Other Topics Concern  . Not on  file  Social History Narrative   Diet: Average American diet: breakfast is cereal or oatmeal, lunch is typical a lean cuisine, dinner is typical spaghetti, meat and sides, tacos, and hamburgers. Eats veggies. Does not eat a lot of fruit. Drinks mostly diet sodas and sweet tea. Trying to drink more water.       Exercise: He is exercising 2-3 days a week. Goes to the park and does brisk walking for about an hour. Trying to increase it to 5 days a week.       Patient is currently going though a divorce   Social Determinants of Health   Financial Resource Strain:   . Difficulty of Paying Living Expenses: Not on file  Food Insecurity:   . Worried About Programme researcher, broadcasting/film/video in the Last Year: Not on file  . Ran Out of Food in the Last Year: Not on file  Transportation Needs:   . Lack of Transportation (Medical): Not on file  . Lack of Transportation (Non-Medical): Not on file  Physical Activity:   . Days of Exercise per Week: Not on file  . Minutes of Exercise per Session: Not on file  Stress:   . Feeling of Stress : Not on file  Social Connections:   . Frequency of Communication with Friends and Family: Not on file  . Frequency of Social Gatherings with Friends and Family: Not on file  . Attends Religious Services: Not on file  . Active Member of Clubs or Organizations: Not on file  . Attends Banker Meetings: Not on file  . Marital Status: Not on file  Intimate Partner Violence:   . Fear of Current or Ex-Partner: Not on file  . Emotionally Abused: Not on file  . Physically Abused: Not on file  . Sexually Abused: Not on file      PHYSICAL EXAM  Vitals:   11/01/20 1246  BP: 138/89  Pulse: 65  Weight: (!) 303 lb 6.4 oz (137.6 kg)  Height: 5\' 7"  (1.702 m)   Body mass index is 47.52 kg/m.  Generalized: Well developed, in no acute distress  Chest: Lungs clear to auscultation bilaterally  Neurological examination  Mentation: Alert oriented to time, place,  history taking. Follows all commands speech and language fluent Cranial nerve II-XII: Extraocular movements were full, visual field were full on confrontational test Head turning and shoulder shrug  were normal and symmetric. Motor: The motor testing reveals 5 over 5 strength of all 4 extremities. Good symmetric motor tone is noted throughout.  Sensory: Sensory testing is intact to soft touch on all 4 extremities. No evidence of extinction is noted.  Gait and station: Gait is normal.    DIAGNOSTIC DATA (LABS, IMAGING, TESTING) - I reviewed patient records, labs, notes, testing and imaging  myself where available.  Lab Results  Component Value Date   WBC 8.6 01/22/2019   HGB 14.2 01/22/2019   HCT 42.1 01/22/2019   MCV 86.1 01/22/2019   PLT 286 01/22/2019      Component Value Date/Time   NA 141 01/22/2019 1533   NA 144 11/08/2017 1603   K 3.6 01/22/2019 1533   CL 108 01/22/2019 1533   CO2 26 01/22/2019 1533   GLUCOSE 82 01/22/2019 1533   BUN 8 01/22/2019 1533   BUN 11 11/08/2017 1603   CREATININE 0.79 01/22/2019 1533   CALCIUM 8.7 (L) 01/22/2019 1533   PROT 7.2 11/08/2017 1603   ALBUMIN 4.3 11/08/2017 1603   AST 21 11/08/2017 1603   ALT 30 11/08/2017 1603   ALKPHOS 92 11/08/2017 1603   BILITOT 0.4 11/08/2017 1603   GFRNONAA >60 01/22/2019 1533   GFRAA >60 01/22/2019 1533   Lab Results  Component Value Date   CHOL 178 11/08/2017   HDL 43 11/08/2017   LDLCALC 117 (H) 11/08/2017   TRIG 91 11/08/2017   CHOLHDL 4.1 11/08/2017      ASSESSMENT AND PLAN 34 y.o. year old male  has a past medical history of History of kidney stones, OSA on CPAP, and Pericardial cyst. here with:  1. OSA on CPAP  - CPAP compliance excellent - Good treatment of AHI  - Encourage patient to use CPAP nightly and > 4 hours each night - F/U in 1 year or sooner if needed   I spent 25 minutes of face-to-face and non-face-to-face time with patient.  This included previsit chart review, lab  review, study review, order entry, electronic health record documentation, patient education.  Butch [name], MSN, NP-C 11/01/2020, 12:26 PM [facility] [address], [address] [place], [place] [address] [phone]

## 2020-11-01 NOTE — Patient Instructions (Signed)

## 2020-11-09 ENCOUNTER — Encounter: Payer: Self-pay | Admitting: Adult Health

## 2021-11-06 ENCOUNTER — Telehealth: Payer: 59 | Admitting: Adult Health

## 2022-01-15 ENCOUNTER — Telehealth: Payer: Self-pay | Admitting: Adult Health

## 2022-03-26 ENCOUNTER — Encounter: Payer: Self-pay | Admitting: Adult Health

## 2022-03-26 ENCOUNTER — Ambulatory Visit (INDEPENDENT_AMBULATORY_CARE_PROVIDER_SITE_OTHER): Payer: 59 | Admitting: Adult Health

## 2022-03-26 VITALS — BP 144/88 | HR 71 | Ht 67.0 in | Wt 302.2 lb

## 2022-03-26 DIAGNOSIS — G4733 Obstructive sleep apnea (adult) (pediatric): Secondary | ICD-10-CM

## 2022-03-26 DIAGNOSIS — Z9989 Dependence on other enabling machines and devices: Secondary | ICD-10-CM

## 2022-03-26 NOTE — Patient Instructions (Signed)
Continue using CPAP nightly and greater than 4 hours each night °If your symptoms worsen or you develop new symptoms please let us know.  ° °

## 2022-03-26 NOTE — Progress Notes (Signed)
? ? ?PATIENT: [name] ?DOB: April 08, 1986 ? ?REASON FOR VISIT: follow up ?HISTORY FROM: patient ?PRIMARY NEUROLOGIST: Dr. Vickey Huger ? ?Chief Complaint  ?Patient presents with  ? Follow-up  ?  Rm 20, CPAP.  DME ADAPT.   ? ? ? ?HISTORY OF PRESENT ILLNESS: ?Today 03/26/22: ?[name] is a 35 year old male with a history of obstructive sleep apnea on CPAP.  He returns today for follow-up.  He reports that the CPAP is working well for him.  He does state that he is having sinus issues. Saw ENT in the past to have polyps removed. Curious if they are back?  Download is below ? ? ? ? ? ?REVIEW OF SYSTEMS: Out of a complete 14 system review of symptoms, the patient complains only of the following symptoms, and all other reviewed systems are negative. ? ?FSS 10 ?ESS 3 ? ?ALLERGIES: ?Allergies  ?Allergen Reactions  ? Vicks Vaporub [Camph-Eucalypt-Men-Turp-Pet] Rash  ? ? ?HOME MEDICATIONS: ?Outpatient Medications Prior to Visit  ?Medication Sig Dispense Refill  ? cetirizine (ZYRTEC) 10 MG tablet Take 10 mg by mouth daily as needed.     ? ?No facility-administered medications prior to visit.  ? ? ?PAST MEDICAL HISTORY: ?Past Medical History:  ?Diagnosis Date  ? History of kidney stones   ? OSA on CPAP   ? Pericardial cyst   ? dx from Ct Abdomen 2013 - no follow up  ? ? ?PAST SURGICAL HISTORY: ?Past Surgical History:  ?Procedure Laterality Date  ? MULTIPLE TOOTH EXTRACTIONS    ? NASAL SEPTOPLASTY W/ TURBINOPLASTY Bilateral 01/29/2019  ? Procedure: NASAL SEPTOPLASTY WITH BILATERAL TURBINATE REDUCTION;  Surgeon: Drema Halon, MD;  Location: [facility] OR;  Service: ENT;  Laterality: Bilateral;  ? NASAL SINUS SURGERY Bilateral 01/29/2019  ? Procedure: ENDOSCOPIC SINUS SURGERY;  Surgeon: Drema Halon, MD;  Location: [facility] OR;  Service: ENT;  Laterality: Bilateral;  ? POLYPECTOMY Right 01/29/2019  ? Procedure: RIGHT NASAL POLYPECTOMY;  Surgeon: Drema Halon, MD;  Location: [facility] OR;  Service: ENT;  Laterality: Right;  ?  WISDOM TOOTH EXTRACTION    ? ? ?FAMILY HISTORY: ?Family History  ?Problem Relation Age of Onset  ? Hypertension Mother   ? Hypertension Father   ? Hyperlipidemia Father   ? Stomach cancer Maternal Grandfather   ? Colon cancer Paternal Grandfather   ? ? ?SOCIAL HISTORY: ?Social History  ? ?Socioeconomic History  ? Marital status: Divorced  ?  Spouse name: [name]  ? Number of children: 0  ? Years of education: College  ? Highest education level: Associate degree: academic program  ?Occupational History  ? Occupation: Art gallery manager.   ?  Comment: [employer]  ?Tobacco Use  ? Smoking status: Never  ? Smokeless tobacco: Never  ?Vaping Use  ? Vaping Use: Never used  ?Substance and Sexual Activity  ? Alcohol use: Yes  ?  Comment: occ.  ? Drug use: No  ? Sexual activity: Yes  ?  Partners: Female  ?  Comment: with monogamous wife  ?Other Topics Concern  ? Not on file  ?Social History Narrative  ? Diet: Average American diet: breakfast is cereal or oatmeal, lunch is typical a lean cuisine, dinner is typical spaghetti, meat and sides, tacos, and hamburgers. Eats veggies. Does not eat a lot of fruit. Drinks mostly diet sodas and sweet tea. Trying to drink more water.   ?   ? Exercise: He is exercising 2-3 days a week. Goes to the park  and does brisk walking for about an hour. Trying to increase it to 5 days a week.   ?   ? Patient is currently going though a divorce  ? ?Social Determinants of Health  ? ?Financial Resource Strain: Not on file  ?Food Insecurity: Not on file  ?Transportation Needs: Not on file  ?Physical Activity: Not on file  ?Stress: Not on file  ?Social Connections: Not on file  ?Intimate Partner Violence: Not on file  ? ? ? ? ?PHYSICAL EXAM ? ?Vitals:  ? 03/26/22 0810  ?BP: (!) 144/88  ?Pulse: 71  ?Weight: (!) 302 lb 3.2 oz (137.1 kg)  ?Height: 5\' 7"  (1.702 m)  ? ?Body mass index is 47.33 kg/m?. ? ?Generalized: Well developed, in no acute distress  ?Chest: Lungs clear to auscultation  bilaterally ? ?Neurological examination  ?Mentation: Alert oriented to time, place, history taking. Follows all commands speech and language fluent ?Cranial nerve II-XII: Extraocular movements were full, visual field were full on confrontational test Head turning and shoulder shrug  were normal and symmetric. ?Motor: The motor testing reveals 5 over 5 strength of all 4 extremities. Good symmetric motor tone is noted throughout.  ?Sensory: Sensory testing is intact to soft touch on all 4 extremities. No evidence of extinction is noted.  ?Gait and station: Gait is normal.  ? ? ?DIAGNOSTIC DATA (LABS, IMAGING, TESTING) ?- I reviewed patient records, labs, notes, testing and imaging myself where available. ? ?Lab Results  ?Component Value Date  ? WBC 8.6 01/22/2019  ? HGB 14.2 01/22/2019  ? HCT 42.1 01/22/2019  ? MCV 86.1 01/22/2019  ? PLT 286 01/22/2019  ? ?   ?Component Value Date/Time  ? NA 141 01/22/2019 1533  ? NA 144 11/08/2017 1603  ? K 3.6 01/22/2019 1533  ? CL 108 01/22/2019 1533  ? CO2 26 01/22/2019 1533  ? GLUCOSE 82 01/22/2019 1533  ? BUN 8 01/22/2019 1533  ? BUN 11 11/08/2017 1603  ? CREATININE 0.79 01/22/2019 1533  ? CALCIUM 8.7 (L) 01/22/2019 1533  ? PROT 7.2 11/08/2017 1603  ? ALBUMIN 4.3 11/08/2017 1603  ? AST 21 11/08/2017 1603  ? ALT 30 11/08/2017 1603  ? ALKPHOS 92 11/08/2017 1603  ? BILITOT 0.4 11/08/2017 1603  ? GFRNONAA >60 01/22/2019 1533  ? GFRAA >60 01/22/2019 1533  ? ?Lab Results  ?Component Value Date  ? CHOL 178 11/08/2017  ? HDL 43 11/08/2017  ? LDLCALC 117 (H) 11/08/2017  ? TRIG 91 11/08/2017  ? CHOLHDL 4.1 11/08/2017  ? ? ? ? ?ASSESSMENT AND PLAN ?36 y.o. year old male  has a past medical history of History of kidney stones, OSA on CPAP, and Pericardial cyst. here with: ? ?OSA on CPAP ? ?- CPAP compliance excellent ?- Good treatment of AHI  ?- Encourage patient to use CPAP nightly and > 4 hours each night ?- F/U in 1 year or sooner if needed ? ? ?20, MSN, NP-C 03/26/2022, 8:33  AM ?[facility] ?[address], [address] ?[place], [place] ?[phone] ? ? ?

## 2023-01-08 ENCOUNTER — Encounter (INDEPENDENT_AMBULATORY_CARE_PROVIDER_SITE_OTHER): Payer: Self-pay | Admitting: Family Medicine

## 2023-01-08 ENCOUNTER — Ambulatory Visit (INDEPENDENT_AMBULATORY_CARE_PROVIDER_SITE_OTHER): Payer: 59 | Admitting: Family Medicine

## 2023-01-08 VITALS — BP 146/91 | HR 66 | Temp 98.3°F | Ht 67.0 in | Wt 294.0 lb

## 2023-01-08 DIAGNOSIS — E669 Obesity, unspecified: Secondary | ICD-10-CM

## 2023-01-08 DIAGNOSIS — E65 Localized adiposity: Secondary | ICD-10-CM | POA: Diagnosis not present

## 2023-01-08 DIAGNOSIS — G4733 Obstructive sleep apnea (adult) (pediatric): Secondary | ICD-10-CM | POA: Diagnosis not present

## 2023-01-08 DIAGNOSIS — R030 Elevated blood-pressure reading, without diagnosis of hypertension: Secondary | ICD-10-CM | POA: Diagnosis not present

## 2023-01-08 DIAGNOSIS — Z6841 Body Mass Index (BMI) 40.0 and over, adult: Secondary | ICD-10-CM

## 2023-01-08 DIAGNOSIS — Z0289 Encounter for other administrative examinations: Secondary | ICD-10-CM

## 2023-01-30 NOTE — Progress Notes (Signed)
Office: [phone]  /  Fax: [phone]   Initial Visit  [name] was seen in clinic today to evaluate for obesity. He is interested in losing weight to improve overall health and reduce the risk of weight related complications. He presents today to review program treatment options, initial physical assessment, and evaluation.     He was referred by: Specialist  When asked what else they would like to accomplish? He states: Lose a target amount of weight : around 200 lbs.  Slow weight gain.  Lives with roommate eats out mostly.  MR 2 times daily.   When asked how has your weight affected you? He states: Having fatigue  Some associated conditions: OSA, severe for the last 3 years.   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   Weight promoting medications identified: has reduced portion sizes.   Current nutrition plan: None  Current level of physical activity: None  Current or previous pharmacotherapy: None  Response to medication: Never tried medications  Past medical history includes:   Past Medical History:  Diagnosis Date   History of kidney stones    OSA on CPAP    Pericardial cyst    dx from Ct Abdomen 2013 - no follow up   Objective:   BP (!) 146/91   Pulse 66   Temp 98.3 F (36.8 C)   Ht 5' 7"$  (1.702 m)   Wt 294 lb (133.4 kg)   SpO2 100%   BMI 46.05 kg/m  He was weighed on the bioimpedance scale: Body mass index is 46.05 kg/m.  Peak Weight:320 lbs ,Visceral Fat Rating:23 , Body Fat%:38.7  General:  Alert, oriented and cooperative. Patient is in no acute distress.  Respiratory: Normal respiratory effort, no problems with respiration noted  Extremities: Normal range of motion.    Mental Status: Normal mood and affect. Normal behavior. Normal judgment and thought content.   Assessment and Plan:  1. Visceral Adiposity Visceral fat rating high at 23.  Discussed goal of 10 or less to reduce risk  of metabolic sequelae from obesity.  Begin active plan for weight reduction.  2. Elevated BP without diagnosis of hypertension Patient denies history of hypertension.  Blood pressure 146/91.  Recheck blood pressure at next visit.  Begin active plan for weight loss.  Treat elevated blood pressure if continues to run greater than 140/90.  3. OSA on CPAP Patient managed by Dr. Brett Lam.  Severe OSA.  Patient is compliant with CPAP use.  Look for improvements in sleep apnea with weight loss.  4. Obesity,current BMI 46.2 1.  Reviewed bioimpedance results.  2.  To begin reducing high intake of SSB's.  We reviewed weight, biometrics, associated medical conditions and contributing factors with patient. He would benefit from weight loss therapy via a modified calorie, low-carb, high-protein nutritional plan tailored to their REE (resting energy expenditure) which will be determined by indirect calorimetry.  We will also assess for cardiometabolic risk and nutritional derangements via fasting serologies at his next appointment.     Obesity Treatment / Action Plan:  Will work on eliminating or reducing the presence of highly palatable, calorie dense foods in the home. Will complete provided nutritional and psychosocial assessment questionnaire before the next appointment. Will be scheduled for indirect calorimetry to determine resting energy expenditure in a fasting state.  This will allow [place] to create a reduced calorie, high-protein meal plan to promote loss of fat mass while preserving muscle  mass. Will think about ideas on how to incorporate physical activity into their daily routine. Will reduce liquid calories and sugary drinks from diet. Was counseled on nutritional approaches to weight loss and benefits of complex carbs and high quality protein as part of nutritional weight management. Was counseled on pharmacotherapy and role as an adjunct in weight management.   Obesity Education  Performed Today:  He was weighed on the bioimpedance scale and results were discussed and documented in the synopsis.  We discussed obesity as a disease and the importance of a more detailed evaluation of all the factors contributing to the disease.  We discussed the importance of long term lifestyle changes which include nutrition, exercise and behavioral modifications as well as the importance of customizing this to his specific health and social needs.  We discussed the benefits of reaching a healthier weight to alleviate the symptoms of existing conditions and reduce the risks of the biomechanical, metabolic and psychological effects of obesity.  [name] appears to be in the action stage of change and states they are ready to start intensive lifestyle modifications and behavioral modifications.  30 minutes was spent today on this visit including the above counseling, pre-visit chart review, and post-visit documentation.  Reviewed by clinician on day of visit: allergies, medications, problem list, medical history, surgical history, family history, social history, and previous encounter notes.  I, Davy Pique, am acting as Location manager for [name], DO.  I have reviewed the above documentation for accuracy and completeness, and I agree with the above. [name], DO

## 2023-02-06 ENCOUNTER — Encounter (INDEPENDENT_AMBULATORY_CARE_PROVIDER_SITE_OTHER): Payer: Self-pay | Admitting: Family Medicine

## 2023-02-06 ENCOUNTER — Ambulatory Visit (INDEPENDENT_AMBULATORY_CARE_PROVIDER_SITE_OTHER): Payer: 59 | Admitting: Family Medicine

## 2023-02-06 VITALS — BP 131/78 | HR 66 | Temp 98.5°F | Ht 67.0 in | Wt 291.0 lb

## 2023-02-06 DIAGNOSIS — R5383 Other fatigue: Secondary | ICD-10-CM

## 2023-02-06 DIAGNOSIS — G4733 Obstructive sleep apnea (adult) (pediatric): Secondary | ICD-10-CM

## 2023-02-06 DIAGNOSIS — Z1331 Encounter for screening for depression: Secondary | ICD-10-CM

## 2023-02-06 DIAGNOSIS — R0602 Shortness of breath: Secondary | ICD-10-CM | POA: Diagnosis not present

## 2023-02-06 DIAGNOSIS — E669 Obesity, unspecified: Secondary | ICD-10-CM

## 2023-02-06 DIAGNOSIS — Z6841 Body Mass Index (BMI) 40.0 and over, adult: Secondary | ICD-10-CM

## 2023-02-06 DIAGNOSIS — E65 Localized adiposity: Secondary | ICD-10-CM

## 2023-02-06 DIAGNOSIS — F3289 Other specified depressive episodes: Secondary | ICD-10-CM | POA: Diagnosis not present

## 2023-02-07 LAB — HEMOGLOBIN A1C
Est. average glucose Bld gHb Est-mCnc: 100 mg/dL
Hgb A1c MFr Bld: 5.1 % (ref 4.8–5.6)

## 2023-02-07 LAB — VITAMIN B12: Vitamin B-12: 531 pg/mL (ref 232–1245)

## 2023-02-07 LAB — COMPREHENSIVE METABOLIC PANEL
ALT: 55 IU/L — ABNORMAL HIGH (ref 0–44)
AST: 24 IU/L (ref 0–40)
Albumin/Globulin Ratio: 1.9 (ref 1.2–2.2)
Albumin: 4.4 g/dL (ref 4.1–5.1)
Alkaline Phosphatase: 103 IU/L (ref 44–121)
BUN/Creatinine Ratio: 16 (ref 9–20)
BUN: 12 mg/dL (ref 6–20)
Bilirubin Total: 0.5 mg/dL (ref 0.0–1.2)
CO2: 22 mmol/L (ref 20–29)
Calcium: 8.9 mg/dL (ref 8.7–10.2)
Chloride: 103 mmol/L (ref 96–106)
Creatinine, Ser: 0.77 mg/dL (ref 0.76–1.27)
Globulin, Total: 2.3 g/dL (ref 1.5–4.5)
Glucose: 83 mg/dL (ref 70–99)
Potassium: 4.4 mmol/L (ref 3.5–5.2)
Sodium: 142 mmol/L (ref 134–144)
Total Protein: 6.7 g/dL (ref 6.0–8.5)
eGFR: 120 mL/min/{1.73_m2} (ref >59)

## 2023-02-07 LAB — CBC WITH DIFFERENTIAL/PLATELET
Basophils Absolute: 0.1 10*3/uL (ref 0.0–0.2)
Basos: 1 %
EOS (ABSOLUTE): 0.1 10*3/uL (ref 0.0–0.4)
Eos: 1 %
Hematocrit: 43.8 % (ref 37.5–51.0)
Hemoglobin: 14.7 g/dL (ref 13.0–17.7)
Immature Grans (Abs): 0 10*3/uL (ref 0.0–0.1)
Immature Granulocytes: 0 %
Lymphocytes Absolute: 1.5 10*3/uL (ref 0.7–3.1)
Lymphs: 20 %
MCH: 29.5 pg (ref 26.6–33.0)
MCHC: 33.6 g/dL (ref 31.5–35.7)
MCV: 88 fL (ref 79–97)
Monocytes Absolute: 0.7 10*3/uL (ref 0.1–0.9)
Monocytes: 9 %
Neutrophils Absolute: 5.1 10*3/uL (ref 1.4–7.0)
Neutrophils: 69 %
Platelets: 282 10*3/uL (ref 150–450)
RBC: 4.99 x10E6/uL (ref 4.14–5.80)
RDW: 12.8 % (ref 11.6–15.4)
WBC: 7.4 10*3/uL (ref 3.4–10.8)

## 2023-02-07 LAB — TSH: TSH: 1.61 u[IU]/mL (ref 0.450–4.500)

## 2023-02-07 LAB — LIPID PANEL WITH LDL/HDL RATIO
Cholesterol, Total: 156 mg/dL (ref 100–199)
HDL: 40 mg/dL (ref >39)
LDL Chol Calc (NIH): 92 mg/dL (ref 0–99)
LDL/HDL Ratio: 2.3 ratio (ref 0.0–3.6)
Triglycerides: 135 mg/dL (ref 0–149)
VLDL Cholesterol Cal: 24 mg/dL (ref 5–40)

## 2023-02-07 LAB — VITAMIN D 25 HYDROXY (VIT D DEFICIENCY, FRACTURES): Vit D, 25-Hydroxy: 20.9 ng/mL — ABNORMAL LOW (ref 30.0–100.0)

## 2023-02-07 LAB — INSULIN, RANDOM: INSULIN: 21.4 u[IU]/mL (ref 2.6–24.9)

## 2023-02-07 LAB — T4, FREE: Free T4: 1.43 ng/dL (ref 0.82–1.77)

## 2023-02-19 NOTE — Progress Notes (Signed)
Thank you for referring [name] to our clinic. The following note includes my evaluation and treatment recommendations.  Chief Complaint:   OBESITY [name] (MR# [id number]) is a 37 y.o. male who presents for evaluation and treatment of obesity and related comorbidities. Current BMI is Body mass index is 45.58 kg/m. [name] has been struggling with his weight for many years and has been unsuccessful in either losing weight, maintaining weight loss, or reaching his healthy weight goal.  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  He started going to the gym with a goal of 3 times per week.  He is doing both cardio and resistance.  He eats out a lot.  Drinks sugar-free soda.    [name] is currently in the action stage of change and ready to dedicate time achieving and maintaining a healthier weight. [name] is interested in becoming our patient and working on intensive lifestyle modifications including (but not limited to) diet and exercise for weight loss.  [name]'s habits were reviewed today and are as follows: he struggles with family and or coworkers weight loss sabotage, his desired weight loss is 91 lbs, he has been heavy most of his life, he started gaining weight at age 73, his heaviest weight ever was 320 pounds, he has significant food cravings issues, he snacks frequently in the evenings, he skips meals frequently, he is frequently drinking liquids with calories, he frequently makes poor food choices, he frequently eats larger portions than normal, and he struggles with emotional eating.  Depression Screen [name]'s Food and Mood (modified PHQ-9) score was 8.  Subjective:   1. Other fatigue [name] denies daytime somnolence and denies waking up still tired. Patient has a history of symptoms of n/a. [name] generally gets 9 hours of sleep per night, and states that he has generally restful sleep. Snoring is present. Apneic episodes are present. Epworth Sleepiness Score is  2.  EKG NSR without ischemic changes.  2. SOBOE (shortness of breath on exertion) [name] notes increasing shortness of breath with exercising and seems to be worsening over time with weight gain. He notes getting out of breath sooner with activity than he used to. This has not gotten worse recently. [name] denies shortness of breath at rest or orthopnea.  3. OSA on CPAP Patient gets 8 to 9 hours of sleep with CPAP.  4. Visceral Adiposity Visceral fat rating 23, goal is less than 10.  We have discussed the increased risk for metabolic disease.  5. Other depression, with emotional eating Bariatric PHQ-9:8 Patient is not on any mood medications.  Assessment/Plan:   1. Other fatigue [name] does feel that his weight is causing his energy to be lower than it should be. Fatigue may be related to obesity, depression or many other causes. Labs will be ordered, and in the meanwhile, [name] will focus on self care including making healthy food choices, increasing physical activity and focusing on stress reduction.  Update labs today.  - EKG 12-Lead - Vitamin B12 - CBC with Differential/Platelet - Comprehensive metabolic panel - Hemoglobin A1c - Insulin, random - Lipid Panel With LDL/HDL Ratio - VITAMIN D 25 Hydroxy (Vit-D Deficiency, Fractures) - TSH - T4, free  2. SOBOE (shortness of breath on exertion) [name] does feel that he gets out of breath more easily that he used to when he exercises. [name]'s shortness of breath appears to be obesity related and exercise induced. He has agreed to work on weight loss and gradually increase exercise  to treat his exercise induced shortness of breath. Will continue to monitor closely.  3. OSA on CPAP Continue 8 to 9 hours of sleep with CPAP.  4. Visceral Adiposity Begin active plan for weight loss, prescribed meal plan.  5. Other depression, with emotional eating Keep junk food triggers out of the house.  Begin working on mindful  eating.  6.Depression Screening [name] had a positive depression screening. Depression is commonly associated with obesity and often results in emotional eating behaviors. We will monitor this closely and work on CBT to help improve the non-hunger eating patterns. Referral to Psychology may be required if no improvement is seen as he continues in our clinic.  7. BMI 45.0-49.9, adult ([place])  8. Obesity with starting BMI of 45.6 200-calorie snack list given.  [name] is currently in the action stage of change and his goal is to continue with weight loss efforts. I recommend [name] begin the structured treatment plan as follows:  He has agreed to the Category 4 Plan.  Exercise goals:  go to the gym 2-3 times per week.    Behavioral modification strategies: increasing lean protein intake, increasing vegetables, increasing water intake, decreasing eating out, no skipping meals, meal planning and cooking strategies, keeping healthy foods in the home, planning for success, and decreasing junk food.  He was informed of the importance of frequent follow-up visits to maximize his success with intensive lifestyle modifications for his multiple health conditions. He was informed we would discuss his lab results at his next visit unless there is a critical issue that needs to be addressed sooner. [name] agreed to keep his next visit at the agreed upon time to discuss these results.  Objective:   Blood pressure 131/78, pulse 66, temperature 98.5 F (36.9 C), height '5\' 7"'$  (1.702 m), weight 291 lb (132 kg), SpO2 98 %. Body mass index is 45.58 kg/m.  EKG: Normal sinus rhythm, rate 70 bpm.  Indirect Calorimeter completed today shows a VO2 of 378 and a REE of 2606.  His calculated basal metabolic rate is [id number] thus his basal metabolic rate is better than expected.  General: Cooperative, alert, well developed, in no acute distress. HEENT: Conjunctivae and lids unremarkable. Cardiovascular: Regular  rhythm.  Lungs: Normal work of breathing. Neurologic: No focal deficits.   Lab Results  Component Value Date   CREATININE 0.77 02/06/2023   BUN 12 02/06/2023   NA 142 02/06/2023   K 4.4 02/06/2023   CL 103 02/06/2023   CO2 22 02/06/2023   Lab Results  Component Value Date   ALT 55 (H) 02/06/2023   AST 24 02/06/2023   ALKPHOS 103 02/06/2023   BILITOT 0.5 02/06/2023   Lab Results  Component Value Date   HGBA1C 5.1 02/06/2023   Lab Results  Component Value Date   INSULIN 21.4 02/06/2023   Lab Results  Component Value Date   TSH 1.610 02/06/2023   Lab Results  Component Value Date   CHOL 156 02/06/2023   HDL 40 02/06/2023   LDLCALC 92 02/06/2023   TRIG 135 02/06/2023   CHOLHDL 4.1 11/08/2017   Lab Results  Component Value Date   WBC 7.4 02/06/2023   HGB 14.7 02/06/2023   HCT 43.8 02/06/2023   MCV 88 02/06/2023   PLT 282 02/06/2023   No results found for: "IRON", "TIBC", "FERRITIN"  Attestation Statements:   Reviewed by clinician on day of visit: allergies, medications, problem list, medical history, surgical history, family history, social history, and previous encounter  notes.  I, Davy Pique, am acting as Location manager for [name], DO.  I have reviewed the above documentation for accuracy and completeness, and I agree with the above. [name], DO

## 2023-02-20 ENCOUNTER — Ambulatory Visit (INDEPENDENT_AMBULATORY_CARE_PROVIDER_SITE_OTHER): Payer: 59 | Admitting: Family Medicine

## 2023-03-31 ENCOUNTER — Encounter (INDEPENDENT_AMBULATORY_CARE_PROVIDER_SITE_OTHER): Payer: Self-pay | Admitting: Family Medicine

## 2023-03-31 ENCOUNTER — Ambulatory Visit (INDEPENDENT_AMBULATORY_CARE_PROVIDER_SITE_OTHER): Payer: 59 | Admitting: Family Medicine

## 2023-03-31 VITALS — BP 133/80 | HR 64 | Temp 98.3°F | Ht 67.0 in | Wt 292.0 lb

## 2023-03-31 DIAGNOSIS — E88819 Insulin resistance, unspecified: Secondary | ICD-10-CM | POA: Diagnosis not present

## 2023-03-31 DIAGNOSIS — G4733 Obstructive sleep apnea (adult) (pediatric): Secondary | ICD-10-CM

## 2023-03-31 DIAGNOSIS — E559 Vitamin D deficiency, unspecified: Secondary | ICD-10-CM

## 2023-03-31 DIAGNOSIS — R7989 Other specified abnormal findings of blood chemistry: Secondary | ICD-10-CM

## 2023-03-31 DIAGNOSIS — Z6841 Body Mass Index (BMI) 40.0 and over, adult: Secondary | ICD-10-CM

## 2023-03-31 DIAGNOSIS — R7401 Elevation of levels of liver transaminase levels: Secondary | ICD-10-CM

## 2023-03-31 MED ORDER — METFORMIN HCL 500 MG PO TABS
500.0000 mg | ORAL_TABLET | Freq: Every day | ORAL | 0 refills | Status: DC
Start: 2023-03-31 — End: 2023-04-28

## 2023-03-31 MED ORDER — VITAMIN D (ERGOCALCIFEROL) 1.25 MG (50000 UNIT) PO CAPS: 50000.0000 [IU] | ORAL_CAPSULE | ORAL | 0 refills | Status: DC

## 2023-03-31 NOTE — Assessment & Plan Note (Signed)
He is consistent with using CPAP for 7 to 8 hours 7 days a week.  He sleeps much better with his CPAP.  He is actively working on weight reduction.  Plan continue using CPAP with a goal of 8 hours of sleep at night.  Continue active plan for weight reduction.

## 2023-03-31 NOTE — Assessment & Plan Note (Signed)
He reports a history of low testosterone with a total testosterone of 230 on a home testing kit.  He complains of difficulty gaining muscle mass as he has been doing weight training at the gym.  He has some fatigue.  He has never used prescription testosterone replacement.  He is consistent with using his CPAP nightly.  Plan: Check testosterone panel today.  Refer to urology if low.

## 2023-03-31 NOTE — Assessment & Plan Note (Signed)
Reviewed labs from last visit.  Fasting insulin was elevated at 21.4.  We discussed insulin resistance and the risk for development of type 2 diabetes.  He is experiencing some increased food cravings likely contributed by hyperinsulinemia.  He has been more consistent with gym workouts 3 days a week.  We discussed adding in short walks during his sedentary work day.  Plan: Continue prescribed dietary plan which is low in sugar and refined carbohydrates.  Add metformin 500 mg once daily with food.  Discussed potential adverse side effects.  Will update BMP and B12 level in the next 2 months.

## 2023-03-31 NOTE — Assessment & Plan Note (Signed)
Reviewed labs from last visit.  His ALT was mildly elevated at 55 with a normal alk phos and AST.  He socially drinks alcohol and denies binge drinking.  He has previously never had a diagnosis of fatty liver disease.  He has a risk for fatty liver disease with a high visceral fat rating and a BMI of 45.  Plan: Continue active plan for weight reduction.  Recheck liver enzymes in the next 2 months.  Will obtain a liver ultrasound if still elevated.  Limit alcohol intake and excess Tylenol use.

## 2023-03-31 NOTE — Assessment & Plan Note (Signed)
New, reviewed lab with patient.  Vitamin D level 20.9.  We discussed complications of vitamin D deficiency including poor immune function, poor bone health and fatigue.  He is currently not on any additional supplements.  Plan: Begin prescription vitamin D 50,000 IU once weekly.  Recheck level in 3 to 4 months.

## 2023-03-31 NOTE — Progress Notes (Signed)
Office: [phone]  /  Fax: [phone]  WEIGHT SUMMARY AND BIOMETRICS  Starting Date: 02/06/23  Starting Weight: 291lb   Weight Lost Since Last Visit: 0   Vitals Temp: 98.3 F (36.8 C) BP: 133/80 Pulse Rate: 64 SpO2: 97 %   Body Composition  Body Fat %: 39 % Fat Mass (lbs): 114 lbs Muscle Mass (lbs): 169.6 lbs Total Body Water (lbs): 132.2 lbs Visceral Fat Rating : 23   HPI  Chief Complaint: OBESITY  [name] is here to discuss his progress with his obesity treatment plan. He is on the the Category 4 Plan and states he is following his eating plan approximately 75 % of the time. He states he is exercising 30 minutes 3 times per week.  Interval History:  Since last office visit he is up 1 lb He is getting used to eating on a schedule He his still struggling with cooking with his roommate He is doing better with grocery shopping and cooking, eating out less He is good about eating more veggies He works from home He feels full with meals Occasional cravings, not a certain time of the day Stress levels higher with with work Using CPAP nightly Concerned about low T  Pharmacotherapy: none  PHYSICAL EXAM:  Blood pressure 133/80, pulse 64, temperature 98.3 F (36.8 C), height 5\' 7"  (1.702 m), weight 292 lb (132.5 kg), SpO2 97 %. Body mass index is 45.73 kg/m.  General: He is overweight, cooperative, alert, well developed, and in no acute distress. PSYCH: Has normal mood, affect and thought process.   Lungs: Normal breathing effort, no conversational dyspnea.   ASSESSMENT AND PLAN  TREATMENT PLAN FOR OBESITY:  Recommended Dietary Goals  [name] is currently in the action stage of change. As such, his goal is to continue weight management plan. He has agreed to the Category 4 Plan.  Behavioral Intervention  We discussed the following Behavioral Modification Strategies today: increasing lean protein intake, increasing vegetables, increasing fiber rich  foods, avoiding skipping meals, work on meal planning and preparation, reading food labels , work on managing stress, creating time for self-care and relaxation measures, and planning for success.  Additional resources provided today: NA  Recommended Physical Activity Goals  [name] has been advised to work up to 150 minutes of moderate intensity aerobic activity a week and strengthening exercises 2-3 times per week for cardiovascular health, weight loss maintenance and preservation of muscle mass.   He has agreed to Work on scheduling and tracking physical activity.   Pharmacotherapy changes for the treatment of obesity: metformin  ASSOCIATED CONDITIONS ADDRESSED TODAY  Insulin resistance Assessment & Plan: Reviewed labs from last visit.  Fasting insulin was elevated at 21.4.  We discussed insulin resistance and the risk for development of type 2 diabetes.  He is experiencing some increased food cravings likely contributed by hyperinsulinemia.  He has been more consistent with gym workouts 3 days a week.  We discussed adding in short walks during his sedentary work day.  Plan: Continue prescribed dietary plan which is low in sugar and refined carbohydrates.  Add metformin 500 mg once daily with food.  Discussed potential adverse side effects.  Will update BMP and B12 level in the next 2 months.  Orders: -     metFORMIN HCl; Take 1 tablet (500 mg total) by mouth daily with breakfast.  Dispense: 30 tablet; Refill: 0  Low testosterone in male Assessment & Plan: He reports a history of low testosterone with a total testosterone  of 230 on a home testing kit.  He complains of difficulty gaining muscle mass as he has been doing weight training at the gym.  He has some fatigue.  He has never used prescription testosterone replacement.  He is consistent with using his CPAP nightly.  Plan: Check testosterone panel today.  Refer to urology if low.  Orders: -     Testosterone,Free and Total  BMI  45.0-49.9, adult  Morbid obesity  OSA on CPAP Assessment & Plan: He is consistent with using CPAP for 7 to 8 hours 7 days a week.  He sleeps much better with his CPAP.  He is actively working on weight reduction.  Plan continue using CPAP with a goal of 8 hours of sleep at night.  Continue active plan for weight reduction.   Vitamin D deficiency Assessment & Plan: New, reviewed lab with patient.  Vitamin D level 20.9.  We discussed complications of vitamin D deficiency including poor immune function, poor bone health and fatigue.  He is currently not on any additional supplements.  Plan: Begin prescription vitamin D 50,000 IU once weekly.  Recheck level in 3 to 4 months.   Elevated ALT measurement Assessment & Plan: Reviewed labs from last visit.  His ALT was mildly elevated at 55 with a normal alk phos and AST.  He socially drinks alcohol and denies binge drinking.  He has previously never had a diagnosis of fatty liver disease.  He has a risk for fatty liver disease with a high visceral fat rating and a BMI of 45.  Plan: Continue active plan for weight reduction.  Recheck liver enzymes in the next 2 months.  Will obtain a liver ultrasound if still elevated.  Limit alcohol intake and excess Tylenol use.   Other orders -     Vitamin D (Ergocalciferol); Take 1 capsule (50,000 Units total) by mouth every 7 (seven) days.  Dispense: 5 capsule; Refill: 0      He was informed of the importance of frequent follow up visits to maximize his success with intensive lifestyle modifications for his multiple health conditions.   ATTESTASTION STATEMENTS:  Reviewed by clinician on day of visit: allergies, medications, problem list, medical history, surgical history, family history, social history, and previous encounter notes pertinent to obesity diagnosis.   I have personally spent 30 minutes total time today in preparation, patient care, nutritional counseling and documentation for this  visit, including the following: review of clinical lab tests; review of medical tests/procedures/services.      Jerry Brink, DO DABFM, DABOM [facility] [address] [place], [place] [address] [phone]

## 2023-04-01 ENCOUNTER — Ambulatory Visit (INDEPENDENT_AMBULATORY_CARE_PROVIDER_SITE_OTHER): Payer: 59 | Admitting: Adult Health

## 2023-04-01 ENCOUNTER — Encounter: Payer: Self-pay | Admitting: Adult Health

## 2023-04-01 VITALS — BP 125/78 | HR 64 | Ht 67.0 in | Wt 295.0 lb

## 2023-04-01 DIAGNOSIS — G4733 Obstructive sleep apnea (adult) (pediatric): Secondary | ICD-10-CM

## 2023-04-01 NOTE — Patient Instructions (Signed)
Continue using CPAP nightly and greater than 4 hours each night °If your symptoms worsen or you develop new symptoms please let us know.  ° °

## 2023-04-01 NOTE — Progress Notes (Signed)
PATIENT: [name] DOB: [birth year]  REASON FOR VISIT: follow up HISTORY FROM: patient PRIMARY NEUROLOGIST: Dr. Vickey Huger  Chief Complaint  Patient presents with   Follow-up    Pt in 9 Pt here for CPAP f/u Pt states no questions or concerns for this visit      HISTORY OF PRESENT ILLNESS: Today 04/01/23:  [name] is a 37 y.o. male with a history of OSA on CPAP. Returns today for follow-up.  His download indicates that he uses machine nightly for compliance of 100%.  He uses machine on average 8 hours and 14 minutes.  He uses machine greater than 4 hours each night.  His residual AHI is 1.8 on 8-13 cmH2O with EPR 3.  He reports that the CPAP continues to work well for him.  He denies any new issues.    03/26/22:[name] is a 37 year old male with a history of obstructive sleep apnea on CPAP.  He returns today for follow-up.  He reports that the CPAP is working well for him.  He does state that he is having sinus issues. Saw ENT in the past to have polyps removed. Curious if they are back?  Download is below      REVIEW OF SYSTEMS: Out of a complete 14 system review of symptoms, the patient complains only of the following symptoms, and all other reviewed systems are negative.   ESS 3  ALLERGIES: Allergies  Allergen Reactions   Vicks Vaporub [Camph-Eucalypt-Men-Turp-Pet] Rash    HOME MEDICATIONS: Outpatient Medications Prior to Visit  Medication Sig Dispense Refill   cetirizine (ZYRTEC) 10 MG tablet Take 10 mg by mouth daily as needed.      Magnesium 100 MG CAPS      metFORMIN (GLUCOPHAGE) 500 MG tablet Take 1 tablet (500 mg total) by mouth daily with breakfast. 30 tablet 0   Multiple Vitamin (ONE-A-DAY MENS PO)      TESTOSTERONE BU Testosterone booster-Nugenix Total T     Vitamin D, Ergocalciferol, (DRISDOL) 1.25 MG (50000 UNIT) CAPS capsule Take 1 capsule (50,000 Units total) by mouth every 7 (seven) days. 5 capsule 0   No facility-administered medications  prior to visit.    PAST MEDICAL HISTORY: Past Medical History:  Diagnosis Date   Allergies    History of kidney stones    OSA on CPAP    Palpitations    Pericardial cyst    dx from [facility] 2013 - no follow up   Sleep apnea     PAST SURGICAL HISTORY: Past Surgical History:  Procedure Laterality Date   MULTIPLE TOOTH EXTRACTIONS     NASAL SEPTOPLASTY W/ TURBINOPLASTY Bilateral 01/29/2019   Procedure: NASAL SEPTOPLASTY WITH BILATERAL TURBINATE REDUCTION;  Surgeon: Drema Halon, MD;  Location: [facility] OR;  Service: ENT;  Laterality: Bilateral;   NASAL SINUS SURGERY Bilateral 01/29/2019   Procedure: ENDOSCOPIC SINUS SURGERY;  Surgeon: Drema Halon, MD;  Location: [facility] OR;  Service: ENT;  Laterality: Bilateral;   POLYPECTOMY Right 01/29/2019   Procedure: RIGHT NASAL POLYPECTOMY;  Surgeon: Drema Halon, MD;  Location: [facility] OR;  Service: ENT;  Laterality: Right;   WISDOM TOOTH EXTRACTION      FAMILY HISTORY: Family History  Problem Relation Age of Onset   Hypertension Mother    Sleep apnea Mother    Obesity Mother    Obesity Father    Sleep apnea Father    Hypertension Father    Hyperlipidemia Father    Alcoholism Father  Stomach cancer Maternal Grandfather    Colon cancer Paternal Grandfather     SOCIAL HISTORY: Social History   Socioeconomic History   Marital status: Divorced    Spouse name: [name]   Number of children: 0   Years of education: [employer] education level: Associate degree: academic program  Occupational History   Occupation: Art gallery manager.     Comment: [employer]   Occupation: Neurosurgeon  Tobacco Use   Smoking status: Never   Smokeless tobacco: Never  Vaping Use   Vaping Use: Never used  Substance and Sexual Activity   Alcohol use: Yes    Comment: occ.   Drug use: No   Sexual activity: Yes    Partners: Female    Comment: with monogamous wife  Other Topics Concern   Not on file   Social History Narrative   Diet: Average American diet: breakfast is cereal or oatmeal, lunch is typical a lean cuisine, dinner is typical spaghetti, meat and sides, tacos, and hamburgers. Eats veggies. Does not eat a lot of fruit. Drinks mostly diet sodas and sweet tea. Trying to drink more water.       Exercise: He is exercising 2-3 days a week. Goes to the park and does brisk walking for about an hour. Trying to increase it to 5 days a week.       Patient is currently going though a divorce   Social Determinants of Health   Financial Resource Strain: Low Risk  (11/08/2017)   Overall Financial Resource Strain (CARDIA)    Difficulty of Paying Living Expenses: Not hard at all  Food Insecurity: No Food Insecurity (11/08/2017)   Hunger Vital Sign    Worried About Running Out of Food in the Last Year: Never true    Ran Out of Food in the Last Year: Never true  Transportation Needs: No Transportation Needs (11/08/2017)   PRAPARE - Administrator, Civil Service (Medical): No    Lack of Transportation (Non-Medical): No  Physical Activity: Sufficiently Active (11/08/2017)   Exercise Vital Sign    Days of Exercise per Week: 3 days    Minutes of Exercise per Session: 60 min  Stress: Not on file  Social Connections: Somewhat Isolated (11/08/2017)   Social Connection and Isolation Panel [NHANES]    Frequency of Communication with Friends and Family: Twice a week    Frequency of Social Gatherings with Friends and Family: Once a week    Attends Religious Services: Never    Database administrator or Organizations: No    Attends Banker Meetings: Never    Marital Status: Married  Catering manager Violence: Not At Risk (11/08/2017)   Humiliation, Afraid, Rape, and Kick questionnaire    Fear of Current or Ex-Partner: No    Emotionally Abused: No    Physically Abused: No    Sexually Abused: No      PHYSICAL EXAM  Vitals:   04/01/23 0810  BP: 125/78  Pulse: 64   Weight: 295 lb (133.8 kg)  Height: 5\' 7"  (1.702 m)   Body mass index is 46.2 kg/m.  Generalized: Well developed, in no acute distress  Chest: Lungs clear to auscultation bilaterally  Neurological examination  Mentation: Alert oriented to time, place, history taking. Follows all commands speech and language fluent Cranial nerve II-XII: Facial symmetry noted   DIAGNOSTIC DATA (LABS, IMAGING, TESTING) - I reviewed patient records, labs, notes, testing and imaging myself where  available.  Lab Results  Component Value Date   WBC 7.4 02/06/2023   HGB 14.7 02/06/2023   HCT 43.8 02/06/2023   MCV 88 02/06/2023   PLT 282 02/06/2023      Component Value Date/Time   NA 142 02/06/2023 0856   K 4.4 02/06/2023 0856   CL 103 02/06/2023 0856   CO2 22 02/06/2023 0856   GLUCOSE 83 02/06/2023 0856   GLUCOSE 82 01/22/2019 1533   BUN 12 02/06/2023 0856   CREATININE 0.77 02/06/2023 0856   CALCIUM 8.9 02/06/2023 0856   PROT 6.7 02/06/2023 0856   ALBUMIN 4.4 02/06/2023 0856   AST 24 02/06/2023 0856   ALT 55 (H) 02/06/2023 0856   ALKPHOS 103 02/06/2023 0856   BILITOT 0.5 02/06/2023 0856   GFRNONAA >60 01/22/2019 1533   GFRAA >60 01/22/2019 1533   Lab Results  Component Value Date   CHOL 156 02/06/2023   HDL 40 02/06/2023   LDLCALC 92 02/06/2023   TRIG 135 02/06/2023   CHOLHDL 4.1 11/08/2017      ASSESSMENT AND PLAN 37 y.o. year old male  has a past medical history of Allergies, History of kidney stones, OSA on CPAP, Palpitations, Pericardial cyst, and Sleep apnea. here with:  OSA on CPAP  - CPAP compliance excellent - Good treatment of AHI  - Encourage patient to use CPAP nightly and > 4 hours each night - F/U in 1 year or sooner if needed   Butch PennyMegan Shauntia Levengood, MSN, NP-C 04/01/2023, 8:40 AM [facility] [address], [address] [place], [place] [address] [phone]

## 2023-04-04 LAB — TESTOSTERONE,FREE AND TOTAL
Testosterone, Free: 10.5 pg/mL (ref 8.7–25.1)
Testosterone: 313 ng/dL (ref 264–916)

## 2023-04-28 ENCOUNTER — Ambulatory Visit (INDEPENDENT_AMBULATORY_CARE_PROVIDER_SITE_OTHER): Payer: 59 | Admitting: Family Medicine

## 2023-04-28 ENCOUNTER — Encounter (INDEPENDENT_AMBULATORY_CARE_PROVIDER_SITE_OTHER): Payer: Self-pay | Admitting: Family Medicine

## 2023-04-28 VITALS — BP 123/80 | HR 62 | Temp 97.6°F | Ht 67.0 in | Wt 294.0 lb

## 2023-04-28 DIAGNOSIS — Z6841 Body Mass Index (BMI) 40.0 and over, adult: Secondary | ICD-10-CM

## 2023-04-28 DIAGNOSIS — E559 Vitamin D deficiency, unspecified: Secondary | ICD-10-CM

## 2023-04-28 DIAGNOSIS — R7989 Other specified abnormal findings of blood chemistry: Secondary | ICD-10-CM

## 2023-04-28 DIAGNOSIS — R7401 Elevation of levels of liver transaminase levels: Secondary | ICD-10-CM

## 2023-04-28 DIAGNOSIS — E88819 Insulin resistance, unspecified: Secondary | ICD-10-CM | POA: Diagnosis not present

## 2023-04-28 MED ORDER — VITAMIN D (ERGOCALCIFEROL) 1.25 MG (50000 UNIT) PO CAPS
50000.0000 [IU] | ORAL_CAPSULE | ORAL | 0 refills | Status: DC
Start: 2023-04-28 — End: 2023-07-03

## 2023-04-28 MED ORDER — VITAMIN D (ERGOCALCIFEROL) 1.25 MG (50000 UNIT) PO CAPS: 50000.0000 [IU] | ORAL_CAPSULE | ORAL | 0 refills | Status: DC

## 2023-04-28 NOTE — Progress Notes (Unsigned)
   Office: [phone]  /  Fax: [phone]  WEIGHT SUMMARY AND BIOMETRICS  Starting Date: 02/06/23  Starting Weight: 291lb   Weight Lost Since Last Visit: 0   Vitals Temp: 97.6 F (36.4 C) BP: 123/80 Pulse Rate: 62 SpO2: 99 %   Body Composition  Body Fat %: 38.3 % Fat Mass (lbs): 112.6 lbs Muscle Mass (lbs): 172.8 lbs Total Body Water (lbs): 135.6 lbs Visceral Fat Rating : 23    HPI  Chief Complaint: OBESITY  [name] is here to discuss his progress with his obesity treatment plan. He is on the the Category 4 Plan and states he is following his eating plan approximately 50-60 % of the time. He states he is exercising 60-120 minutes 2 times per week.   Interval History:  Since last office visit he is up 2 lb Stress levels have been high with work.  He turned his notice in at work and is stating a new job at the end of the month which he is looking forward to He has had problems with his home and he will have 2 weeks off work He is doing some stress eating He started metformin 500 mg daily and has had some dizziness Denies diarrhea He has diet soda, sparkling water  Pharmacotherapy: metformin 500 mg daily  PHYSICAL EXAM:  Blood pressure 123/80, pulse 62, temperature 97.6 F (36.4 C), height 5\' 7"  (1.702 m), weight 294 lb (133.4 kg), SpO2 99 %. Body mass index is 46.05 kg/m.  General: He is overweight, cooperative, alert, well developed, and in no acute distress. PSYCH: Has normal mood, affect and thought process.   Lungs: Normal breathing effort, no conversational dyspnea.   ASSESSMENT AND PLAN  TREATMENT PLAN FOR OBESITY:  Recommended Dietary Goals  Tiam is currently in the action stage of change. As such, his goal is to continue weight management plan. He has agreed to the Category 4 Plan.  Behavioral Intervention  We discussed the following Behavioral Modification Strategies today: increasing lean protein intake, decreasing simple  carbohydrates , increasing vegetables, increasing lower glycemic fruits, increasing fiber rich foods, avoiding skipping meals, increasing water intake, continue to work on implementation of reduced calorie nutritional plan, continue to practice mindfulness when eating, and planning for success.  Additional resources provided today: NA  Recommended Physical Activity Goals  Delia has been advised to work up to 150 minutes of moderate intensity aerobic activity a week and strengthening exercises 2-3 times per week for cardiovascular health, weight loss maintenance and preservation of muscle mass.   He has agreed to Increase physical activity in their day and reduce sedentary time (increase NEAT).  Pharmacotherapy changes for the treatment of obesity:  discontinued metformin due to side effects  ASSOCIATED CONDITIONS ADDRESSED TODAY  There are no diagnoses linked to this encounter.    He was informed of the importance of frequent follow up visits to maximize his success with intensive lifestyle modifications for his multiple health conditions.   ATTESTASTION STATEMENTS:  Reviewed by clinician on day of visit: allergies, medications, problem list, medical history, surgical history, family history, social history, and previous encounter notes pertinent to obesity diagnosis.   I have personally spent 30 minutes total time today in preparation, patient care, nutritional counseling and documentation for this visit, including the following: review of clinical lab tests; review of medical tests/procedures/services.      Glennis Brink, DO DABFM, DABOM [facility] [address] [place], [place] [address] [phone]

## 2023-04-29 NOTE — Assessment & Plan Note (Signed)
His last testosterone level was 313, in the low normal range.  He is taking over-the-counter testosterone booster.  He does have symptoms of fatigue and difficulty gaining muscle mass.  He may be interested in testosterone replacement.  He is already on CPAP nightly for obstructive sleep apnea.  Plan to repeat testosterone levels in June.  Refer to urology if needed.

## 2023-04-29 NOTE — Assessment & Plan Note (Addendum)
Last ALT elevated at 55.  Patient is at risk for fatty liver disease given his BMI of 46 and an elevated visceral fat rating.  He is actively working on weight reduction and healthy dietary changes.  Repeat liver enzymes in June. Minimize intake of alcohol

## 2023-04-29 NOTE — Assessment & Plan Note (Signed)
Overall, patient has gained 3 pounds in the past 3 months of medically supervised weight management.  He has had barriers to progress including high stress levels at home.  He is still motivated to continue working on Optometrist.  He did gain muscle mass and lost body fat since his last visit as reviewed together on his bioimpedance.  Consider use of an antiobesity medication next visit.  Continue prescribed diet. Keep up the good work with weight training several days a week.  Increase walking time to 30 minutes 3 to 4 days a week.

## 2023-04-29 NOTE — Assessment & Plan Note (Signed)
He was started on metformin 500 mg once daily at his last visit for insulin resistance with a fasting insulin level of 21.4.  He had side effects of dizziness on multiple occasions and has discontinued metformin.  He is actively working on reducing his intake of starches and sugar.  Metformin allergy was added to his list.  Avoid future use.  Continue working on a low sugar/low starch diet, dietary logging and his prescribed meal plan.  Plan increase walking time to help with insulin sensitivity.  Consider use of GLP-1 receptor agonist in the future.

## 2023-04-29 NOTE — Assessment & Plan Note (Signed)
Last vitamin D Lab Results  Component Value Date   VD25OH 20.9 (L) 02/06/2023   He is doing well on prescription vitamin D 50,000 IU once weekly.  We discussed a target vitamin D level 50-70.  He is tolerating vitamin D without adverse side effects.  Repeat vitamin D level in June.

## 2023-06-05 ENCOUNTER — Ambulatory Visit (INDEPENDENT_AMBULATORY_CARE_PROVIDER_SITE_OTHER): Payer: Managed Care, Other (non HMO) | Admitting: Family Medicine

## 2023-06-05 ENCOUNTER — Encounter (INDEPENDENT_AMBULATORY_CARE_PROVIDER_SITE_OTHER): Payer: Self-pay | Admitting: Family Medicine

## 2023-06-05 VITALS — BP 121/77 | HR 58 | Temp 97.6°F | Ht 67.0 in | Wt 292.0 lb

## 2023-06-05 DIAGNOSIS — E88819 Insulin resistance, unspecified: Secondary | ICD-10-CM

## 2023-06-05 DIAGNOSIS — E559 Vitamin D deficiency, unspecified: Secondary | ICD-10-CM

## 2023-06-05 DIAGNOSIS — R7989 Other specified abnormal findings of blood chemistry: Secondary | ICD-10-CM | POA: Diagnosis not present

## 2023-06-05 DIAGNOSIS — R7401 Elevation of levels of liver transaminase levels: Secondary | ICD-10-CM | POA: Diagnosis not present

## 2023-06-05 DIAGNOSIS — Z6841 Body Mass Index (BMI) 40.0 and over, adult: Secondary | ICD-10-CM

## 2023-06-05 NOTE — Assessment & Plan Note (Signed)
Patient is awaiting a visit with urology, referral has been placed for low testosterone.  This could be contributing to fatigue, mood changes and lack of skeletal muscle growth.  He is awaiting his new insurance card before he completes this visit.

## 2023-06-05 NOTE — Assessment & Plan Note (Signed)
Last ALT elevated at 55 without a confirmed diagnosis of fatty liver disease.  He is at risk for fatty liver disease with a BMI of 45 and a high visceral fat rating.  Will repeat CMP today.  If elevated, will obtain a liver ultrasound.

## 2023-06-05 NOTE — Assessment & Plan Note (Signed)
Reviewed bioimpedance results and current progress.  He is up 1 pound in the past 4 months of medically supervised weight management.  With his obesity related comorbidities of insulin resistance, obstructive sleep apnea on CPAP and potentially fatty liver disease with an elevated ALT, we discussed more of an aggressive approach with use of antiobesity medications.  He unfortunately could not tolerate metformin due to dizziness.  Will consider use of Rybelsus if covered by insurance for insulin resistance.  Will obtain labs today.  Continue working on category 4 meal plan, increasing regular exercise to 4 days a week.  Consider use of Qsymia or Contrave.

## 2023-06-05 NOTE — Assessment & Plan Note (Signed)
Last vitamin D Lab Results  Component Value Date   VD25OH 20.9 (L) 02/06/2023   He has been on prescription vitamin D 50,000 IU once weekly over the past 4 months.  Energy levels are starting to improve.  Recheck vitamin D level today

## 2023-06-05 NOTE — Assessment & Plan Note (Signed)
Last fasting insulin elevated at 21.4.  He was tried on metformin but discontinued due to side effect of dizziness.  He is actively working on his prescribed dietary plan, reducing added sugar and refined carbohydrates while getting more lean protein and fiber with meals.  He is introduce more regular exercise but has room for more improvement with consistency of both cardio and resistance training up to 4 to 5 days a week.  Consider use of Rybelsus if covered for insulin resistance with new insurance card.

## 2023-06-05 NOTE — Progress Notes (Signed)
Office: [phone]  /  Fax: [phone]  WEIGHT SUMMARY AND BIOMETRICS  Starting Date: 02/06/23  Starting Weight: 291 lb   Weight Lost Since Last Visit: 2 lb   Vitals Temp: 97.6 F (36.4 C) BP: 121/77 Pulse Rate: (!) 58 SpO2: 98 %   Body Composition  Body Fat %: 38 % Fat Mass (lbs): 111.2 lbs Muscle Mass (lbs): 172.8 lbs Total Body Water (lbs): 131.2 lbs Visceral Fat Rating : 23     HPI  Chief Complaint: OBESITY  Midas is here to discuss his progress with his obesity treatment plan. He is on the the Category 4 Plan and states he is following his eating plan approximately 70 % of the time. He states he is exercising weights, cardio 60 minutes 2-3 times per week.   Interval History:  Since last office visit he is down 2 lb This gives him a net weight gain of 1 lb in 4 mos He is struggling to get in breakfast due to not being hungry Skipping breakfast is not causing him to over snack at night He denies many sweet cravings as and has cut back on soda and sweet tea He is using his CPAP and getting 7-8 hrs of sleep at night He started a new job, still working from home and has less stress now. He is doing some walking 2 days a week on average she she is  Pharmacotherapy: none  PHYSICAL EXAM:  Blood pressure 121/77, pulse (!) 58, temperature 97.6 F (36.4 C), height 5\' 7"  (1.702 m), weight 292 lb (132.5 kg), SpO2 98 %. Body mass index is 45.73 kg/m.  General: He is overweight, cooperative, alert, well developed, and in no acute distress. PSYCH: Has normal mood, affect and thought process.   Lungs: Normal breathing effort, no conversational dyspnea.   ASSESSMENT AND PLAN  TREATMENT PLAN FOR OBESITY:  Recommended Dietary Goals  Osiah is currently in the action stage of change. As such, his goal is to continue weight management plan. He has agreed to the Category 4 Plan. - he may substitute out a protein shake for breakfast like Premier Protein -  reviewed 2 snack choices.  Keep evening snack low-carb  Behavioral Intervention  We discussed the following Behavioral Modification Strategies today: increasing lean protein intake, decreasing simple carbohydrates , increasing vegetables, increasing lower glycemic fruits, increasing water intake, work on meal planning and preparation, keeping healthy foods at home, work on managing stress, creating time for self-care and relaxation measures, avoiding temptations and identifying enticing environmental cues, continue to work on implementation of reduced calorie nutritional plan, continue to practice mindfulness when eating, and planning for success.  Additional resources provided today: NA  Recommended Physical Activity Goals  Brannan has been advised to work up to 150 minutes of moderate intensity aerobic activity a week and strengthening exercises 2-3 times per week for cardiovascular health, weight loss maintenance and preservation of muscle mass.   He has agreed to Start aerobic activity with a goal of 150 minutes a week at moderate intensity.   Pharmacotherapy changes for the treatment of obesity: Consider Rybelsus with new insurance card for insulin resistance  ASSOCIATED CONDITIONS ADDRESSED TODAY  Insulin resistance Assessment & Plan: Last fasting insulin elevated at 21.4.  He was tried on metformin but discontinued due to side effect of dizziness.  He is actively working on his prescribed dietary plan, reducing added sugar and refined carbohydrates while getting more lean protein and fiber with meals.  He is introduce more  regular exercise but has room for more improvement with consistency of both cardio and resistance training up to 4 to 5 days a week.  Consider use of Rybelsus if covered for insulin resistance with new insurance card.  Orders: -     Hemoglobin A1c -     Insulin, random  Elevated ALT measurement Assessment & Plan: Last ALT elevated at 55 without a confirmed  diagnosis of fatty liver disease.  He is at risk for fatty liver disease with a BMI of 45 and a high visceral fat rating.  Will repeat CMP today.  If elevated, will obtain a liver ultrasound.  Orders: -     Comprehensive metabolic panel  Vitamin D deficiency Assessment & Plan: Last vitamin D Lab Results  Component Value Date   VD25OH 20.9 (L) 02/06/2023   He has been on prescription vitamin D 50,000 IU once weekly over the past 4 months.  Energy levels are starting to improve.  Recheck vitamin D level today  Orders: -     VITAMIN D 25 Hydroxy (Vit-D Deficiency, Fractures)  Obesity, Class III, BMI 40-49.9 (morbid obesity) (HCC) Assessment & Plan: Reviewed bioimpedance results and current progress.  He is up 1 pound in the past 4 months of medically supervised weight management.  With his obesity related comorbidities of insulin resistance, obstructive sleep apnea on CPAP and potentially fatty liver disease with an elevated ALT, we discussed more of an aggressive approach with use of antiobesity medications.  He unfortunately could not tolerate metformin due to dizziness.  Will consider use of Rybelsus if covered by insurance for insulin resistance.  Will obtain labs today.  Continue working on category 4 meal plan, increasing regular exercise to 4 days a week.  Consider use of Qsymia or Contrave.   Low testosterone in male Assessment & Plan: Patient is awaiting a visit with urology, referral has been placed for low testosterone.  This could be contributing to fatigue, mood changes and lack of skeletal muscle growth.  He is awaiting his new insurance card before he completes this visit.       He was informed of the importance of frequent follow up visits to maximize his success with intensive lifestyle modifications for his multiple health conditions.   ATTESTASTION STATEMENTS:  Reviewed by clinician on day of visit: allergies, medications, problem list, medical history, surgical  history, family history, social history, and previous encounter notes pertinent to obesity diagnosis.   I have personally spent 30 minutes total time today in preparation, patient care, nutritional counseling and documentation for this visit, including the following: review of clinical lab tests; review of medical tests/procedures/services.      Glennis Brink, DO DABFM, DABOM [facility] [address] [place], [place] [address] [phone]

## 2023-06-06 LAB — COMPREHENSIVE METABOLIC PANEL
ALT: 47 IU/L — ABNORMAL HIGH (ref 0–44)
AST: 30 IU/L (ref 0–40)
Albumin/Globulin Ratio: 1.6
Albumin: 4.2 g/dL (ref 4.1–5.1)
Alkaline Phosphatase: 103 IU/L (ref 44–121)
BUN/Creatinine Ratio: 15 (ref 9–20)
BUN: 13 mg/dL (ref 6–20)
Bilirubin Total: 0.4 mg/dL (ref 0.0–1.2)
CO2: 27 mmol/L (ref 20–29)
Calcium: 9.5 mg/dL (ref 8.7–10.2)
Chloride: 103 mmol/L (ref 96–106)
Creatinine, Ser: 0.86 mg/dL (ref 0.76–1.27)
Globulin, Total: 2.7 g/dL (ref 1.5–4.5)
Glucose: 111 mg/dL — ABNORMAL HIGH (ref 70–99)
Potassium: 4.6 mmol/L (ref 3.5–5.2)
Sodium: 142 mmol/L (ref 134–144)
Total Protein: 6.9 g/dL (ref 6.0–8.5)
eGFR: 115 mL/min/{1.73_m2} (ref >59)

## 2023-06-06 LAB — VITAMIN D 25 HYDROXY (VIT D DEFICIENCY, FRACTURES): Vit D, 25-Hydroxy: 50.3 ng/mL (ref 30.0–100.0)

## 2023-06-06 LAB — HEMOGLOBIN A1C
Est. average glucose Bld gHb Est-mCnc: 103 mg/dL
Hgb A1c MFr Bld: 5.2 % (ref 4.8–5.6)

## 2023-06-06 LAB — INSULIN, RANDOM: INSULIN: 67.3 u[IU]/mL — ABNORMAL HIGH (ref 2.6–24.9)

## 2023-07-03 ENCOUNTER — Ambulatory Visit (INDEPENDENT_AMBULATORY_CARE_PROVIDER_SITE_OTHER): Payer: Managed Care, Other (non HMO) | Admitting: Family Medicine

## 2023-07-03 ENCOUNTER — Encounter (INDEPENDENT_AMBULATORY_CARE_PROVIDER_SITE_OTHER): Payer: Self-pay | Admitting: Family Medicine

## 2023-07-03 ENCOUNTER — Telehealth (INDEPENDENT_AMBULATORY_CARE_PROVIDER_SITE_OTHER): Payer: Self-pay | Admitting: Family Medicine

## 2023-07-03 VITALS — BP 133/85 | HR 56 | Temp 97.8°F | Ht 67.0 in | Wt 290.0 lb

## 2023-07-03 DIAGNOSIS — R7989 Other specified abnormal findings of blood chemistry: Secondary | ICD-10-CM | POA: Diagnosis not present

## 2023-07-03 DIAGNOSIS — E559 Vitamin D deficiency, unspecified: Secondary | ICD-10-CM

## 2023-07-03 DIAGNOSIS — R7401 Elevation of levels of liver transaminase levels: Secondary | ICD-10-CM

## 2023-07-03 DIAGNOSIS — E88819 Insulin resistance, unspecified: Secondary | ICD-10-CM | POA: Diagnosis not present

## 2023-07-03 DIAGNOSIS — Z6841 Body Mass Index (BMI) 40.0 and over, adult: Secondary | ICD-10-CM

## 2023-07-03 MED ORDER — VITAMIN D (ERGOCALCIFEROL) 1.25 MG (50000 UNIT) PO CAPS
50000.0000 [IU] | ORAL_CAPSULE | ORAL | 0 refills | Status: DC
Start: 2023-07-03 — End: 2023-07-30

## 2023-07-03 MED ORDER — ZEPBOUND 2.5 MG/0.5ML ~~LOC~~ SOAJ
2.5000 mg | SUBCUTANEOUS | 0 refills | Status: DC
Start: 2023-07-03 — End: 2023-07-30

## 2023-07-03 NOTE — Assessment & Plan Note (Signed)
[name] was awaiting his new insurance to see urology for low testosterone levels.  He has continued to struggle with loss of muscle mass and fatigue from low testosterone.  Referral was placed to [facility] urology today for further evaluation and treatment.

## 2023-07-03 NOTE — Telephone Encounter (Signed)
Prior authorization approved for patients Zepbound.  Outcome Approved today [id number];Review Type:Prior Auth;Coverage Start Date:06/03/2023;Coverage End Date:02/28/2024;

## 2023-07-03 NOTE — Progress Notes (Signed)
Office: [phone]  /  Fax: [phone]  WEIGHT SUMMARY AND BIOMETRICS  Starting Date: 02/06/23  Starting Weight: 291lb   Weight Lost Since Last Visit: 2lb   Vitals Temp: 97.8 F (36.6 C) BP: 133/85 Pulse Rate: (!) 56 SpO2: 98 %   Body Composition  Body Fat %: 38.4 % Fat Mass (lbs): 111.6 lbs Muscle Mass (lbs): 170.4 lbs Total Body Water (lbs): 133.2 lbs Visceral Fat Rating : 23     HPI  Chief Complaint: OBESITY  [name] is here to discuss his progress with his obesity treatment plan. He is on the the Category 4 Plan and states he is following his eating plan approximately 75-80 % of the time. He states he is exercising 60-120 minutes 2-3 times per week.   Interval History:  Since last office visit he is down 2 lb He has a net weight loss of 1 pound in the past 5 months of medically supervised weight management He is sticking to his plan more often He has had occasional cravings He is walking and doing weights 2-3 x a week and has started kayaking 1 x a week He is down 2.4 pounds of muscle mass and up 0.4 pounds of body fat in the past month He was intolerant to metformin with side effect of dizziness  Pharmacotherapy: None  PHYSICAL EXAM:  Blood pressure 133/85, pulse (!) 56, temperature 97.8 F (36.6 C), height 5\' 7"  (1.702 m), weight 290 lb (131.5 kg), SpO2 98%. Body mass index is 45.42 kg/m.  General: He is overweight, cooperative, alert, well developed, and in no acute distress. PSYCH: Has normal mood, affect and thought process.   Lungs: Normal breathing effort, no conversational dyspnea.   ASSESSMENT AND PLAN  TREATMENT PLAN FOR OBESITY:  Recommended Dietary Goals  [name] is currently in the action stage of change. As such, his goal is to continue weight management plan. He has agreed to the Category 4 Plan.  Behavioral Intervention  We discussed the following Behavioral Modification Strategies today: increasing lean protein intake,  decreasing simple carbohydrates , increasing vegetables, increasing lower glycemic fruits, increasing fiber rich foods, avoiding skipping meals, increasing water intake, work on meal planning and preparation, continue to practice mindfulness when eating, planning for success, and better snacking choices.  Additional resources provided today: NA  Recommended Physical Activity Goals  [name] has been advised to work up to 150 minutes of moderate intensity aerobic activity a week and strengthening exercises 2-3 times per week for cardiovascular health, weight loss maintenance and preservation of muscle mass.   He has agreed to Work on scheduling and tracking physical activity.   Pharmacotherapy changes for the treatment of obesity: Begin Zepbound 2.5 mg once weekly injection  ASSOCIATED CONDITIONS ADDRESSED TODAY  Vitamin D deficiency Assessment & Plan: Reviewed labs from last visit.  His vitamin D level has improved from 20.9-50.3.  He did reduce his vitamin D 50,000 IU to every 14 days 2 weeks ago.  He has noticed an increase in fatigue since doing this.  He was not having any adverse side effects from his vitamin D.  Will move his vitamin D back to 50,000 IU once weekly.  He is in the target range of 50-70.  Repeat level in 3 months to ensure that he is not being over replaced.  Orders: -     Vitamin D (Ergocalciferol); Take 1 capsule (50,000 Units total) by mouth every 7 (seven) days.  Dispense: 5 capsule; Refill: 0  Obesity, Class III,  BMI 40-49.9 (morbid obesity) (HCC) Assessment & Plan: [name] has failed to see weight reduction with diet and exercise alone over the past 5 months of medically supervised weight management.  He has been intolerant to metformin.  He is highly motivated to continue working on weight reduction eating control of his appetite and cravings.  He is a good candidate for use of Zepbound 2.5 mg once weekly injection for both obesity management and insulin resistance.   Continue working on a reduced calorie diet focused on lean protein and fiber with meals and regular exercise to include both cardio and resistance training over 3 times a week.  Patient denies a personal or family history of pancreatitis, medullary thyroid carcinoma or multiple endocrine neoplasia type II. Recommend reviewing pen training video online.   Orders: -     Zepbound; Inject 2.5 mg into the skin once a week.  Dispense: 2 mL; Refill: 0  Low testosterone in male Assessment & Plan: [name] was awaiting his new insurance to see urology for low testosterone levels.  He has continued to struggle with loss of muscle mass and fatigue from low testosterone.  Referral was placed to [facility] urology today for further evaluation and treatment.  Orders: -     Ambulatory referral to Urology  Insulin resistance Assessment & Plan: Reviewed lab from last visit.  His fasting insulin was very high.  He admits to not being fasting the morning of his labs.  He had discontinued metformin due to dizziness a few months ago.  He is working on reducing his intake of sugar and starches.  He has been working out including both cardio and resistance training 2-3 times a week  Continue regular exercise.  Continue to limit intake of sweets.  He will benefit from use of a GLP-1/GIP receptor agonist as he was intolerant to metformin   Elevated ALT measurement Assessment & Plan: Reviewed lab findings from last visit.  His ALT still remains mildly elevated.  He denies heavy intake of alcohol or regular Tylenol use.  He has not had a confirmed diagnosis of fatty liver disease but is at risk for fatty liver with a BMI of 45 and a high visceral fat rating.  He is starting to see reduction in ALT levels with lifestyle changes.  He agrees to rechecking hepatic function panel in 3 to 4 months.  If his ALT remains elevated, will obtain an ultrasound of the liver.  Continue active plan for weight reduction Limit alcohol  intake       He was informed of the importance of frequent follow up visits to maximize his success with intensive lifestyle modifications for his multiple health conditions.   ATTESTASTION STATEMENTS:  Reviewed by clinician on day of visit: allergies, medications, problem list, medical history, surgical history, family history, social history, and previous encounter notes pertinent to obesity diagnosis.   I have personally spent 30 minutes total time today in preparation, patient care, nutritional counseling and documentation for this visit, including the following: review of clinical lab tests; review of medical tests/procedures/services.      Glennis Brink, DO DABFM, DABOM [facility] [address] [place], [place] [address] [phone]

## 2023-07-03 NOTE — Telephone Encounter (Signed)
Prior authorization done for patients Zepbound. Waiting on determination.

## 2023-07-03 NOTE — Assessment & Plan Note (Signed)
[name] has failed to see weight reduction with diet and exercise alone over the past 5 months of medically supervised weight management.  He has been intolerant to metformin.  He is highly motivated to continue working on weight reduction eating control of his appetite and cravings.  He is a good candidate for use of Zepbound 2.5 mg once weekly injection for both obesity management and insulin resistance.  Continue working on a reduced calorie diet focused on lean protein and fiber with meals and regular exercise to include both cardio and resistance training over 3 times a week.  Patient denies a personal or family history of pancreatitis, medullary thyroid carcinoma or multiple endocrine neoplasia type II. Recommend reviewing pen training video online.

## 2023-07-03 NOTE — Assessment & Plan Note (Signed)
Reviewed labs from last visit.  His vitamin D level has improved from 20.9-50.3.  He did reduce his vitamin D 50,000 IU to every 14 days 2 weeks ago.  He has noticed an increase in fatigue since doing this.  He was not having any adverse side effects from his vitamin D.  Will move his vitamin D back to 50,000 IU once weekly.  He is in the target range of 50-70.  Repeat level in 3 months to ensure that he is not being over replaced.

## 2023-07-03 NOTE — Assessment & Plan Note (Signed)
Reviewed lab findings from last visit.  His ALT still remains mildly elevated.  He denies heavy intake of alcohol or regular Tylenol use.  He has not had a confirmed diagnosis of fatty liver disease but is at risk for fatty liver with a BMI of 45 and a high visceral fat rating.  He is starting to see reduction in ALT levels with lifestyle changes.  He agrees to rechecking hepatic function panel in 3 to 4 months.  If his ALT remains elevated, will obtain an ultrasound of the liver.  Continue active plan for weight reduction Limit alcohol intake

## 2023-07-03 NOTE — Assessment & Plan Note (Signed)
Reviewed lab from last visit.  His fasting insulin was very high.  He admits to not being fasting the morning of his labs.  He had discontinued metformin due to dizziness a few months ago.  He is working on reducing his intake of sugar and starches.  He has been working out including both cardio and resistance training 2-3 times a week  Continue regular exercise.  Continue to limit intake of sweets.  He will benefit from use of a GLP-1/GIP receptor agonist as he was intolerant to metformin

## 2023-07-17 ENCOUNTER — Telehealth: Payer: Self-pay | Admitting: Adult Health

## 2023-07-17 NOTE — Telephone Encounter (Signed)
I spoke with the patient and discussed the process for getting a new machine.  He is amenable to proceeding with a sleep study next.

## 2023-07-17 NOTE — Telephone Encounter (Signed)
Pt called and stated that he has a [employer] Cpap machine and they no longer are making them and now he is up for a new one and he would like to be advise on what will happen now that it has been 5 years.

## 2023-07-17 NOTE — Telephone Encounter (Signed)
Pt was setup 03/06/18

## 2023-07-18 ENCOUNTER — Other Ambulatory Visit: Payer: Self-pay | Admitting: Neurology

## 2023-07-18 DIAGNOSIS — E65 Localized adiposity: Secondary | ICD-10-CM

## 2023-07-18 DIAGNOSIS — G4733 Obstructive sleep apnea (adult) (pediatric): Secondary | ICD-10-CM

## 2023-07-18 DIAGNOSIS — E88819 Insulin resistance, unspecified: Secondary | ICD-10-CM

## 2023-07-18 DIAGNOSIS — R7989 Other specified abnormal findings of blood chemistry: Secondary | ICD-10-CM

## 2023-07-18 NOTE — Progress Notes (Signed)
Patient's machine is a [employer] , now 36 years old -  [employer] models from 5 plus years ago were recalled in 2021-2023- (Did he not get offered a replacement ?)  He now needs a new HST to get a new machine, so I placed the order for him . He will follow up with NP [place].   Melvyn Novas, MD

## 2023-07-30 ENCOUNTER — Ambulatory Visit (INDEPENDENT_AMBULATORY_CARE_PROVIDER_SITE_OTHER): Payer: Managed Care, Other (non HMO) | Admitting: Family Medicine

## 2023-07-30 ENCOUNTER — Encounter (INDEPENDENT_AMBULATORY_CARE_PROVIDER_SITE_OTHER): Payer: Self-pay | Admitting: Family Medicine

## 2023-07-30 VITALS — BP 143/92 | HR 60 | Temp 98.1°F | Ht 67.0 in | Wt 285.0 lb

## 2023-07-30 DIAGNOSIS — R7401 Elevation of levels of liver transaminase levels: Secondary | ICD-10-CM

## 2023-07-30 DIAGNOSIS — E559 Vitamin D deficiency, unspecified: Secondary | ICD-10-CM

## 2023-07-30 DIAGNOSIS — E88819 Insulin resistance, unspecified: Secondary | ICD-10-CM

## 2023-07-30 DIAGNOSIS — Z6841 Body Mass Index (BMI) 40.0 and over, adult: Secondary | ICD-10-CM

## 2023-07-30 MED ORDER — ZEPBOUND 5 MG/0.5ML ~~LOC~~ SOAJ: 5.0000 mg | SUBCUTANEOUS | 0 refills | Status: DC

## 2023-07-30 MED ORDER — VITAMIN D (ERGOCALCIFEROL) 1.25 MG (50000 UNIT) PO CAPS
50000.0000 [IU] | ORAL_CAPSULE | ORAL | 0 refills | Status: AC
Start: 2023-07-30 — End: ?

## 2023-07-30 NOTE — Assessment & Plan Note (Signed)
Fasting insulin high at 21.4, off of metformin due to dizziness Working on reducing intake of sugar and starches while increasing exercise frequency  Anticipate further improvements in IR on Zepbound along with diet and exercise changes

## 2023-07-30 NOTE — Assessment & Plan Note (Signed)
Lab Results  Component Value Date   ALT 47 (H) 06/05/2023    Plan to recheck hepatic function panel in October and plan to get liver ultrasound if still high

## 2023-07-30 NOTE — Progress Notes (Signed)
Office: [phone]  /  Fax: [phone]  WEIGHT SUMMARY AND BIOMETRICS  Starting Date: 02/06/23  Starting Weight: 291lb   Weight Lost Since Last Visit: 5lb   Vitals Temp: 98.1 F (36.7 C) BP: (!) 143/92 Pulse Rate: 60 SpO2: 99 %   Body Composition  Body Fat %: 37.5 % Fat Mass (lbs): 107 lbs Muscle Mass (lbs): 169.4 lbs Total Body Water (lbs): 128.6 lbs Visceral Fat Rating : 22     HPI  Chief Complaint: OBESITY  [name] is here to discuss his progress with his obesity treatment plan. He is on the the Category 4 Plan and states he is following his eating plan approximately 20 % of the time. He states he is exercising 60-120 minutes 2 times per week.   Interval History:  Since last office visit he is down 5 lb He did start on Zepbound 2.5 mg weekly x 4  He is feeling improving satiety He denies heartburn or nausea He is forcing himself to eat meals and had is having to slow down his eating pace He had some harder stools with some bright red bleeding  He had a hard time hydrating with a recent trip with flying and getting stuck He got stuck in the airport and didn't have CPAP or enough water intake last week  Pharmacotherapy: Zepbound 2.5 mg weekly  PHYSICAL EXAM:  Blood pressure (!) 143/92, pulse 60, temperature 98.1 F (36.7 C), height 5\' 7"  (1.702 m), weight 285 lb (129.3 kg), SpO2 99%. Body mass index is 44.64 kg/m.  General: He is overweight, cooperative, alert, well developed, and in no acute distress. PSYCH: Has normal mood, affect and thought process.   Lungs: Normal breathing effort, no conversational dyspnea.   ASSESSMENT AND PLAN  TREATMENT PLAN FOR OBESITY:  Recommended Dietary Goals  [name] is currently in the action stage of change. As such, his goal is to continue weight management plan. He has agreed to keeping a food journal and adhering to recommended goals of 1800 calories and 110 g of  protein.  Behavioral Intervention  We  discussed the following Behavioral Modification Strategies today: increasing lean protein intake, decreasing simple carbohydrates , increasing vegetables, increasing lower glycemic fruits, avoiding skipping meals, increasing water intake, keeping healthy foods at home, continue to practice mindfulness when eating, and planning for success.  Additional resources provided today: NA  Recommended Physical Activity Goals  [name] has been advised to work up to 150 minutes of moderate intensity aerobic activity a week and strengthening exercises 2-3 times per week for cardiovascular health, weight loss maintenance and preservation of muscle mass.   He has agreed to Think about ways to increase daily physical activity and overcoming barriers to exercise  Pharmacotherapy changes for the treatment of obesity: increase Zepbound to 5 mg weekly  ASSOCIATED CONDITIONS ADDRESSED TODAY  Insulin resistance Assessment & Plan: Fasting insulin high at 21.4, off of metformin due to dizziness Working on reducing intake of sugar and starches while increasing exercise frequency  Anticipate further improvements in IR on Zepbound along with diet and exercise changes    Obesity, Class III, BMI 40-49.9 (morbid obesity) (HCC) -     Zepbound; Inject 5 mg into the skin once a week.  Dispense: 2 mL; Refill: 0  Vitamin D deficiency Assessment & Plan: Last vitamin D Lab Results  Component Value Date   VD25OH 50.3 06/05/2023   He is back on RX vitamin D 50,000 international units  weekly as his energy level dropped  when he moved to q 14 days.   Plan to recheck vitamin D level in the next 3 mos  Orders: -     Vitamin D (Ergocalciferol); Take 1 capsule (50,000 Units total) by mouth every 7 (seven) days.  Dispense: 5 capsule; Refill: 0  BMI 40.0-44.9, adult (HCC)  Elevated ALT measurement Assessment & Plan: Lab Results  Component Value Date   ALT 47 (H) 06/05/2023    Plan to recheck hepatic function panel  in October and plan to get liver ultrasound if still high       He was informed of the importance of frequent follow up visits to maximize his success with intensive lifestyle modifications for his multiple health conditions.   ATTESTASTION STATEMENTS:  Reviewed by clinician on day of visit: allergies, medications, problem list, medical history, surgical history, family history, social history, and previous encounter notes pertinent to obesity diagnosis.   I have personally spent 30 minutes total time today in preparation, patient care, nutritional counseling and documentation for this visit, including the following: review of clinical lab tests; review of medical tests/procedures/services.      Glennis Brink, DO DABFM, DABOM [facility] [address] [place], [place] [address] [phone]

## 2023-07-30 NOTE — Assessment & Plan Note (Signed)
Last vitamin D Lab Results  Component Value Date   VD25OH 50.3 06/05/2023   He is back on RX vitamin D 50,000 international units  weekly as his energy level dropped when he moved to q 14 days.   Plan to recheck vitamin D level in the next 3 mos

## 2023-08-05 ENCOUNTER — Ambulatory Visit (INDEPENDENT_AMBULATORY_CARE_PROVIDER_SITE_OTHER): Payer: Managed Care, Other (non HMO) | Admitting: Family Medicine

## 2023-08-18 NOTE — Telephone Encounter (Signed)
Phone call to [facility], they never got our referral. I have faxed over the referral to them. Once they receive that they will contact patient regarding the schedule.

## 2023-08-27 ENCOUNTER — Encounter: Payer: Self-pay | Admitting: Family Medicine

## 2023-08-27 ENCOUNTER — Ambulatory Visit: Payer: Managed Care, Other (non HMO) | Admitting: Family Medicine

## 2023-08-27 VITALS — BP 142/84 | HR 68 | Temp 98.3°F | Ht 67.0 in | Wt 281.0 lb

## 2023-08-27 DIAGNOSIS — R7401 Elevation of levels of liver transaminase levels: Secondary | ICD-10-CM | POA: Diagnosis not present

## 2023-08-27 DIAGNOSIS — E559 Vitamin D deficiency, unspecified: Secondary | ICD-10-CM

## 2023-08-27 DIAGNOSIS — E88819 Insulin resistance, unspecified: Secondary | ICD-10-CM

## 2023-08-27 DIAGNOSIS — R7989 Other specified abnormal findings of blood chemistry: Secondary | ICD-10-CM

## 2023-08-27 DIAGNOSIS — G4733 Obstructive sleep apnea (adult) (pediatric): Secondary | ICD-10-CM

## 2023-08-27 DIAGNOSIS — Z6841 Body Mass Index (BMI) 40.0 and over, adult: Secondary | ICD-10-CM

## 2023-08-27 MED ORDER — ZEPBOUND 5 MG/0.5ML ~~LOC~~ SOAJ
5.0000 mg | SUBCUTANEOUS | 0 refills | Status: AC
Start: 2023-08-27 — End: ?

## 2023-08-27 NOTE — Assessment & Plan Note (Signed)
Referral has previously been placed to [facility].  He has called to make an appointment scheduled for next week.  He has had low testosterone levels with complaints of fatigue.  He has had more difficulty gaining lean muscle mass as he has added in resistance training workouts and increase his intake of dietary protein.  Keep upcoming visit as scheduled with urology for low testosterone.

## 2023-08-27 NOTE — Assessment & Plan Note (Signed)
He has been using CPAP at night, managed by Dr. Vickey Huger.  He is scheduled for home sleep study and plans to get a new CPAP machine due to a recall on his current machine.  Continue plan for weight reduction.  Aim for 7 to 8 hours of sleep at night using CPAP.

## 2023-08-27 NOTE — Progress Notes (Signed)
Office: [phone]  /  Fax: [phone]  WEIGHT SUMMARY AND BIOMETRICS  Starting Date: 02/06/23  Starting Weight: 291lb   Weight Lost Since Last Visit: 4lb   Vitals Temp: 98.3 F (36.8 C) BP: (!) 142/84 Pulse Rate: 68 SpO2: 99 %   Body Composition  Body Fat %: 37.3 % Fat Mass (lbs): 105 lbs Muscle Mass (lbs): 167.8 lbs Total Body Water (lbs): 128.2 lbs Visceral Fat Rating : 22    HPI  Chief Complaint: OBESITY  [name] is here to discuss his progress with his obesity treatment plan. He is on the the Category 4 Plan and states he is following his eating plan approximately 0 % of the time. He states he is exercising 60-120 minutes 3 times per week.   Interval History:  Since last office visit he is down 4 lb He has a good amount of satiety from Zepbound 5 mg weekly He is trying to not skip meals He has a protein shake in the mornings He is working on increasing his water intake He is cooking more for dinner and gets in good amount of veggies He will be traveling to [place] for work soon He has been going to the gym 3 x a week doing cardio and weight training He has a net weight loss of 10 lb in the past 6 mos  Pharmacotherapy: Zepbound 5 mg weekly  PHYSICAL EXAM:  Blood pressure (!) 142/84, pulse 68, temperature 98.3 F (36.8 C), height 5\' 7"  (1.702 m), weight 281 lb (127.5 kg), SpO2 99%. Body mass index is 44.01 kg/m.  General: He is overweight, cooperative, alert, well developed, and in no acute distress. PSYCH: Has normal mood, affect and thought process.   Lungs: Normal breathing effort, no conversational dyspnea.   ASSESSMENT AND PLAN  TREATMENT PLAN FOR OBESITY:  Recommended Dietary Goals  [name] is currently in the action stage of change. As such, his goal is to continue weight management plan. He has agreed to the Category 4 Plan.  Behavioral Intervention  We discussed the following Behavioral Modification Strategies today:  increasing lean protein intake, decreasing simple carbohydrates , increasing vegetables, increasing lower glycemic fruits, increasing water intake, work on meal planning and preparation, keeping healthy foods at home, continue to work on implementation of reduced calorie nutritional plan, continue to practice mindfulness when eating, and planning for success.  Additional resources provided today: NA  Recommended Physical Activity Goals  [name] has been advised to work up to 150 minutes of moderate intensity aerobic activity a week and strengthening exercises 2-3 times per week for cardiovascular health, weight loss maintenance and preservation of muscle mass.   He has agreed to Work on scheduling and tracking physical activity.   Pharmacotherapy changes for the treatment of obesity: none  ASSOCIATED CONDITIONS ADDRESSED TODAY  Insulin resistance Assessment & Plan: Last fasting insulin elevated at 21.4.  He had to discontinue metformin due to side effects of dizziness.  He has reduced his intake of added sugar and refined carbohydrates.  He is doing well with weight reduction on Zepbound.  Repeat fasting insulin level with October labs.   Obesity, Class III, BMI 40-49.9 (morbid obesity) (HCC) -     Zepbound; Inject 5 mg into the skin once a week.  Dispense: 2 mL; Refill: 0  Vitamin D deficiency Assessment & Plan: Last vitamin D Lab Results  Component Value Date   VD25OH 50.3 06/05/2023   Taking vitamin D 50,000 IU once weekly.  Energy level has improved.  Denies adverse side effects.  Repeat vitamin D level in October   BMI 40.0-44.9, adult [facility])  Elevated ALT measurement Assessment & Plan: ALT has been mildly elevated without a confirmed diagnosis of nonalcoholic fatty liver disease.  He has cut back on intake of alcohol and is actively working on weight reduction.  Repeat hepatic function panel with upcoming labs in October.  If elevated, will obtain a liver  ultrasound.   OSA on CPAP Assessment & Plan: He has been using CPAP at night, managed by Dr. Vickey Huger.  He is scheduled for home sleep study and plans to get a new CPAP machine due to a recall on his current machine.  Continue plan for weight reduction.  Aim for 7 to 8 hours of sleep at night using CPAP.   Low testosterone in male Assessment & Plan: Referral has previously been placed to [facility].  He has called to make an appointment scheduled for next week.  He has had low testosterone levels with complaints of fatigue.  He has had more difficulty gaining lean muscle mass as he has added in resistance training workouts and increase his intake of dietary protein.  Keep upcoming visit as scheduled with urology for low testosterone.       He was informed of the importance of frequent follow up visits to maximize his success with intensive lifestyle modifications for his multiple health conditions.   ATTESTASTION STATEMENTS:  Reviewed by clinician on day of visit: allergies, medications, problem list, medical history, surgical history, family history, social history, and previous encounter notes pertinent to obesity diagnosis.   I have personally spent 30 minutes total time today in preparation, patient care, nutritional counseling and documentation for this visit, including the following: review of clinical lab tests; review of medical tests/procedures/services.      Glennis Brink, DO DABFM, DABOM [facility] [address] [place], [place] [address] [phone]

## 2023-08-27 NOTE — Assessment & Plan Note (Signed)
ALT has been mildly elevated without a confirmed diagnosis of nonalcoholic fatty liver disease.  He has cut back on intake of alcohol and is actively working on weight reduction.  Repeat hepatic function panel with upcoming labs in October.  If elevated, will obtain a liver ultrasound.

## 2023-08-27 NOTE — Assessment & Plan Note (Signed)
Last vitamin D Lab Results  Component Value Date   VD25OH 50.3 06/05/2023   Taking vitamin D 50,000 IU once weekly.  Energy level has improved.  Denies adverse side effects.  Repeat vitamin D level in October

## 2023-08-27 NOTE — Assessment & Plan Note (Signed)
Last fasting insulin elevated at 21.4.  He had to discontinue metformin due to side effects of dizziness.  He has reduced his intake of added sugar and refined carbohydrates.  He is doing well with weight reduction on Zepbound.  Repeat fasting insulin level with October labs.

## 2023-09-02 ENCOUNTER — Ambulatory Visit (INDEPENDENT_AMBULATORY_CARE_PROVIDER_SITE_OTHER): Payer: Managed Care, Other (non HMO) | Admitting: Neurology

## 2023-09-02 DIAGNOSIS — G4733 Obstructive sleep apnea (adult) (pediatric): Secondary | ICD-10-CM | POA: Diagnosis not present

## 2023-09-02 DIAGNOSIS — R7989 Other specified abnormal findings of blood chemistry: Secondary | ICD-10-CM

## 2023-09-02 DIAGNOSIS — E65 Localized adiposity: Secondary | ICD-10-CM

## 2023-09-02 DIAGNOSIS — E88819 Insulin resistance, unspecified: Secondary | ICD-10-CM

## 2023-09-08 NOTE — Progress Notes (Signed)
   Copy on our behalf to

## 2023-09-17 NOTE — Addendum Note (Signed)
Addended by: Melvyn Novas on: 09/17/2023 07:50 AM   Modules accepted: Orders

## 2023-09-17 NOTE — Telephone Encounter (Signed)
[name], CMA  Smith Robert, Candie Chroman, Radene Ou New orders have been placed for the above pt, DOB: 12/21/1986 Thanks  New, Maryruth Bun, Abbe Amsterdam, CMA; Kathe Becton; New, Candie Chroman, Efraim Kaufmann; 1 other Received, thank you!

## 2023-09-17 NOTE — Procedures (Signed)
[facility]   HOME SLEEP TEST REPORT ( MAIL OUT TEST by Watch PAT)   STUDY DATE:  09-08-2023  Jerry Peach " [name]' Male, 37 y.o., 04-30-1986 MRN: [id number]   ORDERING CLINICIAN: Melvyn Novas, MD     CLINICAL INFORMATION/HISTORY: 37 year-old male: Insulin resistant , obese , Vit D deficient. 07-18-2023- Patient's machine is a [employer] , now 36 years old -  [employer] models from 5 plus years ago were recalled in 2021-2023-   He now needs a new HST to get a new machine, so I placed the order for him . He will follow up with NP [place]  Presented in 2018 waking up gasping for air, his wife had witnessed him to have apnea and snoring.  He also felt that his sleep no longer has the same restorative and refreshing quality it used to have.  He wakes up up to 4 times at night not always sure what wakes him usually being able to go back to sleep he has on occasion fall asleep while sitting watching TV, and had fallen asleep while driving. PSG SPLIT study January 2019;showed  a total of 127 respiratory events:  101 obstructive apneas, 26 hypopneas without additional respiratory event related arousals (RERAs). Loud Snoring was noted.    The total APNEA/HYPOPNEA INDEX (AHI) was 83.3 /hour. He started on CPAP 2019 -His download indicates that he uses machine nightly for compliance of 100%. He uses machine on average 8 hours and 14 minutes. He uses machine greater than 4 hours each night. His residual AHI is 1.8 on 8-13 cmH2O with EPR 3. 90% pressure is 13 cm water- He reports that the CPAP continues to work well for him. He denies any new issues.      Epworth sleepiness score: 3/24. FSS N/A   BMI: 46,.2 kg/m   Neck Circumference: not indicated   FINDINGS:   Sleep Summary:   Total Recording Time (hours, min):   7 hours 14 minutes  Total Sleep Time (hours, min):     6 hours 24 minutes            Percent REM (%): 19.8%                                        Respiratory Indices by AASM scoring criteria:   Calculated pAHI (per hour):    88.5/h   , of these 533 events , 22.4 %  were central sleep apnea.              REM pAHI:     63.5/h                                            NREM pAHI:    95/h                          Positional AHI:    The patient slept 250 minutes in supine with an AHI of 89.5/h followed by 121 minutes in right lateral position with an AHI of 84.8/h.  No prone sleep was recorded.  Snoring reached a mean threshold of 42 dB and was present for 40% of the total recorded sleep time.  Oxygen Saturation Statistics:          O2 Saturation Range (%):       Between a nadir at 73% with a maximum saturation of 99% with a mean oxygen saturation of 93%                                O2 Saturation (minutes) <89%:   8.6 minutes        Pulse Rate Statistics:   Pulse Mean (bpm):    56 bpm, bradycardic             Pulse Range:   Between 34 and 107 bpm.               IMPRESSION:  This HST confirms the presence of severe non- REM sleep dependent complex sleep apnea. The majority of apneas was indicated to be obstructive in origin, about a quarter of all apneas were central in origin.  This complex apnea is associated with severe bradycardia and hypoxia.   RECOMMENDATION: Continuation of auto CPAP with a pressure range between 6 and 18 cm water pressure 3 cm EPR, interface of patient's choice, heated humidification. Follow-up visit with nurse practitioner Everlene Other between day 60 and 90 of therapy on the new auto- titration device.    INTERPRETING PHYSICIAN:   Melvyn Novas, MD

## 2023-09-22 ENCOUNTER — Encounter: Payer: Self-pay | Admitting: Family Medicine

## 2023-09-22 ENCOUNTER — Ambulatory Visit: Payer: Managed Care, Other (non HMO) | Admitting: Family Medicine

## 2023-09-22 VITALS — BP 136/84 | HR 63 | Temp 98.1°F | Ht 67.0 in | Wt 281.0 lb

## 2023-09-22 DIAGNOSIS — R7989 Other specified abnormal findings of blood chemistry: Secondary | ICD-10-CM

## 2023-09-22 DIAGNOSIS — G4733 Obstructive sleep apnea (adult) (pediatric): Secondary | ICD-10-CM | POA: Diagnosis not present

## 2023-09-22 DIAGNOSIS — R632 Polyphagia: Secondary | ICD-10-CM | POA: Diagnosis not present

## 2023-09-22 DIAGNOSIS — R7401 Elevation of levels of liver transaminase levels: Secondary | ICD-10-CM

## 2023-09-22 DIAGNOSIS — E559 Vitamin D deficiency, unspecified: Secondary | ICD-10-CM | POA: Diagnosis not present

## 2023-09-22 DIAGNOSIS — E88819 Insulin resistance, unspecified: Secondary | ICD-10-CM | POA: Diagnosis not present

## 2023-09-22 DIAGNOSIS — Z6841 Body Mass Index (BMI) 40.0 and over, adult: Secondary | ICD-10-CM

## 2023-09-22 MED ORDER — ZEPBOUND 5 MG/0.5ML ~~LOC~~ SOAJ: 5.0000 mg | SUBCUTANEOUS | 0 refills | Status: DC

## 2023-09-22 MED ORDER — VITAMIN D (ERGOCALCIFEROL) 1.25 MG (50000 UNIT) PO CAPS: 50000.0000 [IU] | ORAL_CAPSULE | ORAL | 0 refills | Status: DC

## 2023-09-22 NOTE — Assessment & Plan Note (Signed)
Improving on Zepbound 5 mg weekly without adverse SE  Continue to focus on eating schedule, listening to full cues, lean protein and fiber with meals

## 2023-09-22 NOTE — Assessment & Plan Note (Signed)
Started Clomid with urology, tolerating well and has follow up visit to repeat labs Office notes are not available for review

## 2023-09-22 NOTE — Assessment & Plan Note (Signed)
Reviewed notes from Dr Vickey Huger Tested + for severe OSA, on CPAP nightly but current machine was recalled and he is awaiting a new one Has not been sleeping well lately  Continue active plan for weight reduction

## 2023-09-22 NOTE — Assessment & Plan Note (Signed)
At risk for hepatic steatosis Has limited intake of alcohol and avoid excess use of tylenol  Repeat LFTs next visit If elevated, will get a RUQ ultrasound

## 2023-09-22 NOTE — Assessment & Plan Note (Signed)
Last vitamin D Lab Results  Component Value Date   VD25OH 50.3 06/05/2023   Doing well on RX vitamin D weekly  Repeat lab next visit

## 2023-09-22 NOTE — Assessment & Plan Note (Signed)
Working on reducing intake of sugar and starches Has room to ramp up walking and resistance training Intolerant to metformin  Repeat fasting insulin next visit

## 2023-09-22 NOTE — Progress Notes (Signed)
Office: [phone]  /  Fax: [phone]  WEIGHT SUMMARY AND BIOMETRICS  Starting Date: 02/06/23  Starting Weight: 291lb   Weight Lost Since Last Visit: 0lb   Vitals Temp: 98.1 F (36.7 C) BP: 136/84 Pulse Rate: 63 SpO2: 96 %   Body Composition  Body Fat %: 37.2 % Fat Mass (lbs): 104.6 lbs Muscle Mass (lbs): 168 lbs Total Body Water (lbs): 128.4 lbs Visceral Fat Rating : 22   HPI  Chief Complaint: OBESITY  [name] is here to discuss his progress with his obesity treatment plan. He is on the the Category 4 Plan and states he is following his eating plan approximately 75 % of the time. He states he is exercising 60-120 minutes 3 times per week.   Interval History:  Since last office visit he is down 0 lb He is up 0.2 lb of muscle mass and down 0.4 lb of body fat in the past month He has a net weight loss of 10 lb in the past 7 mos He has adequate satiety from Zepbound 5 mg weekly injection He has some mild nausea the day after injecting He did travel to [place] for work and ate out for dinners more He traveled to a concert this weekend He has not been working out as much  Pharmacotherapy: Zepbound 5 mg weekly  PHYSICAL EXAM:  Blood pressure 136/84, pulse 63, temperature 98.1 F (36.7 C), height 5\' 7"  (1.702 m), weight 281 lb (127.5 kg), SpO2 96%. Body mass index is 44.01 kg/m.  General: He is overweight, cooperative, alert, well developed, and in no acute distress. PSYCH: Has normal mood, affect and thought process.   Lungs: Normal breathing effort, no conversational dyspnea.   ASSESSMENT AND PLAN  TREATMENT PLAN FOR OBESITY:  Recommended Dietary Goals  [name] is currently in the action stage of change. As such, his goal is to continue weight management plan. He has agreed to the Category 4 Plan.  Behavioral Intervention  We discussed the following Behavioral Modification Strategies today: increasing lean protein intake, decreasing simple  carbohydrates , increasing vegetables, increasing lower glycemic fruits, increasing fiber rich foods, increasing water intake, work on meal planning and preparation, keeping healthy foods at home, continue to practice mindfulness when eating, and planning for success. - can rotate in overnight oats + [employer] milk/ a fruit serving for breakfast - reduce meals out  Additional resources provided today: NA  Recommended Physical Activity Goals  [name] has been advised to work up to 150 minutes of moderate intensity aerobic activity a week and strengthening exercises 2-3 times per week for cardiovascular health, weight loss maintenance and preservation of muscle mass.   He has agreed to [employer] strengthening exercises with a goal of 2-3 sessions a week   Pharmacotherapy changes for the treatment of obesity: none  ASSOCIATED CONDITIONS ADDRESSED TODAY  Polyphagia Assessment & Plan: Improving on Zepbound 5 mg weekly without adverse SE  Continue to focus on eating schedule, listening to full cues, lean protein and fiber with meals  Orders: -     Zepbound; Inject 5 mg into the skin once a week.  Dispense: 2 mL; Refill: 0  Vitamin D deficiency Assessment & Plan: Last vitamin D Lab Results  Component Value Date   VD25OH 50.3 06/05/2023   Doing well on RX vitamin D weekly  Repeat lab next visit  Orders: -     Vitamin D (Ergocalciferol); Take 1 capsule (50,000 Units total) by mouth every 7 (seven) days.  Dispense: 12  capsule; Refill: 0  Morbid obesity (HCC) with starting BMI 45  BMI 40.0-44.9, adult (HCC)  OSA on CPAP Assessment & Plan: Reviewed notes from Dr Dohmeier Tested + for severe OSA, on CPAP nightly but current machine was recalled and he is awaiting a new one Has not been sleeping well lately  Continue active plan for weight reduction   Insulin resistance Assessment & Plan: Working on reducing intake of sugar and starches Has room to ramp up walking and resistance  training Intolerant to metformin  Repeat fasting insulin next visit   Low testosterone in male Assessment & Plan: Started Clomid with urology, tolerating well and has follow up visit to repeat labs Office notes are not available for review   Elevated ALT measurement Assessment & Plan: At risk for hepatic steatosis Has limited intake of alcohol and avoid excess use of tylenol  Repeat LFTs next visit If elevated, will get a RUQ ultrasound        He was informed of the importance of frequent follow up visits to maximize his success with intensive lifestyle modifications for his multiple health conditions.   ATTESTASTION STATEMENTS:  Reviewed by clinician on day of visit: allergies, medications, problem list, medical history, surgical history, family history, social history, and previous encounter notes pertinent to obesity diagnosis.   I have personally spent 30 minutes total time today in preparation, patient care, nutritional counseling and documentation for this visit, including the following: review of clinical lab tests; review of medical tests/procedures/services.      Glennis Brink, DO DABFM, DABOM [facility] [address] [place], [place] [address] [phone]

## 2023-10-20 ENCOUNTER — Encounter: Payer: Self-pay | Admitting: Family Medicine

## 2023-10-20 ENCOUNTER — Ambulatory Visit: Payer: Managed Care, Other (non HMO) | Admitting: Family Medicine

## 2023-10-20 VITALS — BP 123/85 | HR 64 | Temp 97.8°F | Ht 67.0 in | Wt 277.0 lb

## 2023-10-20 DIAGNOSIS — E559 Vitamin D deficiency, unspecified: Secondary | ICD-10-CM | POA: Diagnosis not present

## 2023-10-20 DIAGNOSIS — Z6841 Body Mass Index (BMI) 40.0 and over, adult: Secondary | ICD-10-CM

## 2023-10-20 DIAGNOSIS — E88819 Insulin resistance, unspecified: Secondary | ICD-10-CM | POA: Diagnosis not present

## 2023-10-20 DIAGNOSIS — R7401 Elevation of levels of liver transaminase levels: Secondary | ICD-10-CM

## 2023-10-20 DIAGNOSIS — R7989 Other specified abnormal findings of blood chemistry: Secondary | ICD-10-CM | POA: Diagnosis not present

## 2023-10-20 MED ORDER — ZEPBOUND 7.5 MG/0.5ML ~~LOC~~ SOAJ: 7.5000 mg | SUBCUTANEOUS | 0 refills | Status: DC

## 2023-10-20 NOTE — Assessment & Plan Note (Signed)
Last vitamin D Lab Results  Component Value Date   VD25OH 50.3 06/05/2023   He has been taking RX vitamin D weekly Energy level is improving  Recheck level today

## 2023-10-20 NOTE — Assessment & Plan Note (Signed)
Improving Starting to see waning satiety from Zepbound 5 mg weekly Reviewed calorie intake, practicing intuitive eating, prioritizing protein and proper hydration Maintaining lean muscle mass  Increase Zepbound to 7.5 mg weekly injection Call if any problems

## 2023-10-20 NOTE — Assessment & Plan Note (Signed)
Seeing urology, notes not available for review He reports using testosterone replacement under the care of urology and testosterone levels have improved He is gaining lean mass and weight training Energy levels are improving  Keep f/u with urology

## 2023-10-20 NOTE — Progress Notes (Addendum)
Office: [phone]  /  Fax: [phone]  WEIGHT SUMMARY AND BIOMETRICS  Starting Date: 02/06/23  Starting Weight: 291 lb   Weight Lost Since Last Visit: 4 lb   Vitals Temp: 97.8 F (36.6 C) BP: 123/85 Pulse Rate: 64 SpO2: 100 %   Body Composition  Body Fat %: 36.4 % Fat Mass (lbs): 101 lbs Muscle Mass (lbs): 167.8 lbs Total Body Water (lbs): 127.6 lbs Visceral Fat Rating : 21     HPI  Chief Complaint: OBESITY  [name] is here to discuss his progress with his obesity treatment plan. He is on the the Category 4 Plan and states he is following his eating plan approximately 60 % of the time. He states he is exercising at the gym doing cardio and weights 60-120 minutes 3 times per week.   Interval History:  Since last office visit he is down 4 lb in the past 4 weeks He has had heartburn from Zepbound 5 mg weekly with some waning satiety He has reduced his calorie intake to 1300- 1600 cal/ day He is maintaining his muscle mass He is doing a good mostly weight training workout 3 x a week He is not hungry in the morning, mostly skipping breakfast and some days has a protein shake for lunch Denies feeling excess hunger at dinner or oversnacking at night  Pharmacotherapy: Zepbound 5 mg weekly  PHYSICAL EXAM:  Blood pressure 123/85, pulse 64, temperature 97.8 F (36.6 C), height 5\' 7"  (1.702 m), weight 277 lb (125.6 kg), SpO2 100%. Body mass index is 43.38 kg/m.  General: He is overweight, cooperative, alert, well developed, and in no acute distress. PSYCH: Has normal mood, affect and thought process.   Lungs: Normal breathing effort, no conversational dyspnea.   ASSESSMENT AND PLAN  TREATMENT PLAN FOR OBESITY:  Recommended Dietary Goals  [name] is currently in the action stage of change. As such, his goal is to continue weight management plan. He has agreed to keeping a food journal and adhering to recommended goals of 1300-1600 calories and 125+ g of   protein.  Behavioral Intervention  We discussed the following Behavioral Modification Strategies today: increasing lean protein intake to established goals, increasing water intake , work on meal planning and preparation, work on tracking and journaling calories using tracking application, keeping healthy foods at home, planning for success, and continue to work on maintaining a reduced calorie state, getting the recommended amount of protein, incorporating whole foods, making healthy choices, staying well hydrated and practicing mindfulness when eating..  Additional resources provided today: NA  Recommended Physical Activity Goals  Maxine has been advised to work up to 150 minutes of moderate intensity aerobic activity a week and strengthening exercises 2-3 times per week for cardiovascular health, weight loss maintenance and preservation of muscle mass.   He has agreed to Work on scheduling and tracking physical activity.   Pharmacotherapy changes for the treatment of obesity: increase Zepbound to 7.5 mg weekly  ASSOCIATED CONDITIONS ADDRESSED TODAY  Vitamin D deficiency Assessment & Plan: Last vitamin D Lab Results  Component Value Date   VD25OH 50.3 06/05/2023   He has been taking RX vitamin D weekly Energy level is improving  Recheck level today  Orders: -     VITAMIN D 25 Hydroxy (Vit-D Deficiency, Fractures)  Morbid obesity (HCC) Assessment & Plan: Improving Starting to see waning satiety from Zepbound 5 mg weekly Reviewed calorie intake, practicing intuitive eating, prioritizing protein and proper hydration Maintaining lean muscle mass  Increase Zepbound to 7.5 mg weekly injection Call if any problems  Orders: -     Zepbound; Inject 7.5 mg into the skin once a week.  Dispense: 2 mL; Refill: 0  Insulin resistance -     Insulin, random -     Hemoglobin A1c  Elevated ALT measurement -     Comprehensive metabolic panel  BMI 40.0-44.9, adult (HCC)  Low  testosterone in male Assessment & Plan: Seeing urology, notes not available for review He reports using testosterone replacement under the care of urology and testosterone levels have improved He is gaining lean mass and weight training Energy levels are improving  Keep f/u with urology       He was informed of the importance of frequent follow up visits to maximize his success with intensive lifestyle modifications for his multiple health conditions.   ATTESTASTION STATEMENTS:  Reviewed by clinician on day of visit: allergies, medications, problem list, medical history, surgical history, family history, social history, and previous encounter notes pertinent to obesity diagnosis.   I have personally spent 30 minutes total time today in preparation, patient care, nutritional counseling and documentation for this visit, including the following: review of clinical lab tests; review of medical tests/procedures/services.      Glennis Brink, DO DABFM, DABOM [facility] [address] [place], [place] [address] [phone]

## 2023-10-21 LAB — COMPREHENSIVE METABOLIC PANEL
ALT: 17 [IU]/L (ref 0–44)
AST: 19 [IU]/L (ref 0–40)
Albumin: 4.2 g/dL (ref 4.1–5.1)
Alkaline Phosphatase: 65 [IU]/L (ref 44–121)
BUN/Creatinine Ratio: 19 (ref 9–20)
BUN: 18 mg/dL (ref 6–20)
Bilirubin Total: 0.4 mg/dL (ref 0.0–1.2)
CO2: 22 mmol/L (ref 20–29)
Calcium: 9.5 mg/dL (ref 8.7–10.2)
Chloride: 104 mmol/L (ref 96–106)
Creatinine, Ser: 0.95 mg/dL (ref 0.76–1.27)
Globulin, Total: 2.7 g/dL (ref 1.5–4.5)
Glucose: 84 mg/dL (ref 70–99)
Potassium: 4.3 mmol/L (ref 3.5–5.2)
Sodium: 140 mmol/L (ref 134–144)
Total Protein: 6.9 g/dL (ref 6.0–8.5)
eGFR: 106 mL/min/{1.73_m2} (ref >59)

## 2023-10-21 LAB — INSULIN, RANDOM: INSULIN: 22.8 u[IU]/mL (ref 2.6–24.9)

## 2023-10-21 LAB — HEMOGLOBIN A1C
Est. average glucose Bld gHb Est-mCnc: 94 mg/dL
Hgb A1c MFr Bld: 4.9 % (ref 4.8–5.6)

## 2023-10-21 LAB — VITAMIN D 25 HYDROXY (VIT D DEFICIENCY, FRACTURES): Vit D, 25-Hydroxy: 67.3 ng/mL (ref 30.0–100.0)

## 2023-11-18 ENCOUNTER — Encounter: Payer: Self-pay | Admitting: Family Medicine

## 2023-11-18 ENCOUNTER — Telehealth: Payer: Self-pay | Admitting: *Deleted

## 2023-11-18 ENCOUNTER — Ambulatory Visit: Payer: Managed Care, Other (non HMO) | Admitting: Family Medicine

## 2023-11-18 VITALS — BP 124/79 | HR 70 | Temp 97.6°F | Ht 67.0 in | Wt 267.0 lb

## 2023-11-18 DIAGNOSIS — E66813 Obesity, class 3: Secondary | ICD-10-CM

## 2023-11-18 DIAGNOSIS — R5383 Other fatigue: Secondary | ICD-10-CM | POA: Diagnosis not present

## 2023-11-18 DIAGNOSIS — E559 Vitamin D deficiency, unspecified: Secondary | ICD-10-CM | POA: Diagnosis not present

## 2023-11-18 DIAGNOSIS — G4733 Obstructive sleep apnea (adult) (pediatric): Secondary | ICD-10-CM

## 2023-11-18 DIAGNOSIS — E88819 Insulin resistance, unspecified: Secondary | ICD-10-CM | POA: Diagnosis not present

## 2023-11-18 DIAGNOSIS — Z6841 Body Mass Index (BMI) 40.0 and over, adult: Secondary | ICD-10-CM

## 2023-11-18 MED ORDER — ZEPBOUND 7.5 MG/0.5ML ~~LOC~~ SOAJ: 7.5000 mg | SUBCUTANEOUS | 0 refills | Status: DC

## 2023-11-18 NOTE — Assessment & Plan Note (Signed)
Improving.  Reviewed results with patient.  Fasting insulin elevated at 22.8 on last month's labs with a HOMA-IR score of 4.7 c/w insulin resistance.  He was intolerant to metformin but is doing very well on Zepbound for obesity management in combination with a reduced kcal low sugar/ low carb diet.  He has been more consistent with weight training 2-3 x a week.  Continue current lifestyle changes Plan to work on tracking daily steps with a goal of > 8,000 daily

## 2023-11-18 NOTE — Assessment & Plan Note (Signed)
He reports good compliance with CPAP nightly but a higher level of fatigue over the past month  Aim for a good 8 hrs of sleep nightly with CPAP Continue active plan for weight reduction

## 2023-11-18 NOTE — Progress Notes (Signed)
Office: [phone]  /  Fax: [phone]  WEIGHT SUMMARY AND BIOMETRICS  Starting Date: 02/06/03  Starting Weight: 291lb   Weight Lost Since Last Visit: 10lb   Vitals Temp: 97.6 F (36.4 C) BP: 124/79 Pulse Rate: 70 SpO2: 97 %   Body Composition  Body Fat %: 35 % Fat Mass (lbs): 93.4 lbs Muscle Mass (lbs): 165 lbs Total Body Water (lbs): 124.4 lbs Visceral Fat Rating : 19    HPI  Chief Complaint: OBESITY  [name] is here to discuss his progress with his obesity treatment plan. He is on the the Category 4 Plan and states he is following his eating plan approximately 90 % of the time. He states he is exercising 120 minutes 2-3 times per week.  Interval History:  Since last office visit he is down 10 lb This gives him a net weight loss of 24 lb in the past 9 mos of medically supervised weight management Muscle loss over the past 9 mos has been 5.2 lb which is 21% of his total body weight loss He did go up on Zepbound to 7.5 mg weekly He has improved satiety with this dose Denies N/V/ heartburn or constipation He has done better with tracking workouts and calories, consistently weight training 2-3 x a week He averages 1400 kcal/ day with improved satiety He is cooking more at home  Pharmacotherapy: Zepbound 7.5 mg weekly  PHYSICAL EXAM:  Blood pressure 124/79, pulse 70, temperature 97.6 F (36.4 C), height 5\' 7"  (1.702 m), weight 267 lb (121.1 kg), SpO2 97%. Body mass index is 41.82 kg/m.  General: He is overweight, cooperative, alert, well developed, and in no acute distress. PSYCH: Has normal mood, affect and thought process.   Lungs: Normal breathing effort, no conversational dyspnea.  ASSESSMENT AND PLAN  TREATMENT PLAN FOR OBESITY:  Recommended Dietary Goals  Cloyde is currently in the action stage of change. As such, his goal is to continue weight management plan. He has agreed to logging daily intake with a goal 1400-1800 kcal/ day This  should include 110-150 g of protein daily.  Behavioral Intervention  We discussed the following Behavioral Modification Strategies today: increasing lean protein intake to established goals, increasing vegetables, increasing lower glycemic fruits, increasing water intake , work on meal planning and preparation, work on Psychologist, counselling, planning for success, celebration eating strategies, and continue to work on maintaining a reduced calorie state, getting the recommended amount of protein, incorporating whole foods, making healthy choices, staying well hydrated and practicing mindfulness when eating..  Additional resources provided today: NA  Recommended Physical Activity Goals  [name] has been advised to work up to 150 minutes of moderate intensity aerobic activity a week and strengthening exercises 2-3 times per week for cardiovascular health, weight loss maintenance and preservation of muscle mass.   He has agreed to Work on scheduling and tracking physical activity.   Pharmacotherapy changes for the treatment of obesity: none  ASSOCIATED CONDITIONS ADDRESSED TODAY  Insulin resistance Assessment & Plan: Improving.  Reviewed results with patient.  Fasting insulin elevated at 22.8 on last month's labs with a HOMA-IR score of 4.7 c/w insulin resistance.  He was intolerant to metformin but is doing very well on Zepbound for obesity management in combination with a reduced kcal low sugar/ low carb diet.  He has been more consistent with weight training 2-3 x a week.  Continue current lifestyle changes Plan to work on tracking daily steps with a goal  of > 8,000 daily   Vitamin D deficiency Assessment & Plan: Last vitamin D Lab Results  Component Value Date   VD25OH 67.3 10/20/2023   Improving.  Reviewed labs from last visit.  Energy level has worsened over the past month but unlikely due to low vitamin D given most recent labs.  He has been  taking RX vitamin D weekly  Change over from RX vitamin D weekly to OTC daily vitamin D3 2,000 international units  daily   Class 3 severe obesity due to excess calories with body mass index (BMI) of 40.0 to 44.9 in adult, unspecified whether serious comorbidity present (HCC) -     Zepbound; Inject 7.5 mg into the skin once a week.  Dispense: 2 mL; Refill: 0  Other fatigue Assessment & Plan: Worsened over the past month. Getting adequate sleep and most recent labs reviewed from 10/28 did not reveal any cause for fatigue.  He has been on testosterone replacement thru urology.  He has reduced his calorie intake due to increasing dosage of Zepbound and has cut back on his water intake.  His food recall includes a very low carb diet.  Recommend hydrating well with ~120 oz of water daily, adding one sugar frree electrolyte packet daily Increase carb intake to include fresh fruit, beans, sweet potatoes esp pre workout Will plan to add on a Men's MVI daily if not already started   OSA on CPAP Assessment & Plan: He reports good compliance with CPAP nightly but a higher level of fatigue over the past month  Aim for a good 8 hrs of sleep nightly with CPAP Continue active plan for weight reduction       He was informed of the importance of frequent follow up visits to maximize his success with intensive lifestyle modifications for his multiple health conditions.   ATTESTASTION STATEMENTS:  Reviewed by clinician on day of visit: allergies, medications, problem list, medical history, surgical history, family history, social history, and previous encounter notes pertinent to obesity diagnosis.   I have personally spent 30 minutes total time today in preparation, patient care, nutritional counseling and documentation for this visit, including the following: review of clinical lab tests; review of medical tests/procedures/services.      Glennis Brink, DO DABFM, DABOM [facility] [address] [place], [place] [address] [phone]

## 2023-11-18 NOTE — Assessment & Plan Note (Signed)
Last vitamin D Lab Results  Component Value Date   VD25OH 67.3 10/20/2023   Improving.  Reviewed labs from last visit.  Energy level has worsened over the past month but unlikely due to low vitamin D given most recent labs.  He has been taking RX vitamin D weekly  Change over from RX vitamin D weekly to OTC daily vitamin D3 2,000 international units  daily

## 2023-11-18 NOTE — Assessment & Plan Note (Signed)
Worsened over the past month. Getting adequate sleep and most recent labs reviewed from 10/28 did not reveal any cause for fatigue.  He has been on testosterone replacement thru urology.  He has reduced his calorie intake due to increasing dosage of Zepbound and has cut back on his water intake.  His food recall includes a very low carb diet.  Recommend hydrating well with ~120 oz of water daily, adding one sugar frree electrolyte packet daily Increase carb intake to include fresh fruit, beans, sweet potatoes esp pre workout Will plan to add on a Men's MVI daily if not already started

## 2023-11-18 NOTE — Telephone Encounter (Signed)
Prior authorization done for patients Zepbound. Per [employer] no clinical override needed. Patient should be able to pick up prescription.

## 2023-11-18 NOTE — Patient Instructions (Addendum)
Finish out [employer] vitamin D weekly then change over to OTC vitamin D3 2,000 international units  daily  Check out MyNetDairy Ap or ParrotPal Ap to track daily calories  Aim for 1400-1800 cal/ day This should include 110-150 g of protein daily Listen to your fullness cues Hydrate well Add one sugar free electrolyte packet daily Add in healthy carbs: - fresh fruit, beans, sweet potato

## 2023-11-25 ENCOUNTER — Encounter: Payer: Self-pay | Admitting: Anesthesiology

## 2023-11-25 ENCOUNTER — Telehealth (INDEPENDENT_AMBULATORY_CARE_PROVIDER_SITE_OTHER): Payer: Self-pay | Admitting: Family Medicine

## 2023-11-25 NOTE — Telephone Encounter (Signed)
Pt is calling in stating that he missed his Rx: Zepbound this past Sunday and would like to see if the provider would send in a different quantity so that he is able to get back on it as soon as possible.  Pt would also like to know if he is able to get the medication today should he just wait until Sunday to take it or what is it that he needs to do.  Pt would like to have a call back to elaborate on it more.

## 2023-11-26 NOTE — Progress Notes (Unsigned)
PATIENT: [name] DOB: [birth year]  REASON FOR VISIT: follow up HISTORY FROM: patient PRIMARY NEUROLOGIST: Dr. Vickey Huger  Virtual Visit via Video Note  I connected with [name] on 11/27/23 at  8:15 AM EST by a video enabled telemedicine application located remotely at [facility] and verified that I am speaking with the correct person using two identifiers who was located at their own home.   I discussed the limitations of evaluation and management by telemedicine and the availability of in person appointments. The patient expressed understanding and agreed to proceed.   PATIENT: [name] DOB: 1986/01/30  REASON FOR VISIT: follow up HISTORY FROM: patient  HISTORY OF PRESENT ILLNESS: Today 11/27/23:  [name] is a 37 y.o. male with a history of OSA on CPAP. Returns today for follow-up. Reports that CPAP is working well.  Continues to notice the benefit.  He is on Zepbound and has lost approximately 25 pounds.  His goal weight is between 200-230 pounds.  His download is below      REVIEW OF SYSTEMS: Out of a complete 14 system review of symptoms, the patient complains only of the following symptoms, and all other reviewed systems are negative.  ALLERGIES: Allergies  Allergen Reactions   Metformin And Related Other (See Comments)    Felt dizzy   Vicks Vaporub [Camph-Eucalypt-Men-Turp-Pet] Rash    HOME MEDICATIONS: Outpatient Medications Prior to Visit  Medication Sig Dispense Refill   cetirizine (ZYRTEC) 10 MG tablet Take 10 mg by mouth daily as needed.      Magnesium 100 MG CAPS      Multiple Vitamin (ONE-A-DAY MENS PO)      tirzepatide (ZEPBOUND) 7.5 MG/0.5ML Pen Inject 7.5 mg into the skin once a week. 2 mL 0   No facility-administered medications prior to visit.    PAST MEDICAL HISTORY: Past Medical History:  Diagnosis Date   Allergies    History of kidney stones    OSA on CPAP    Palpitations    Pericardial cyst     dx from [facility] Abdom[facility] 2013 - no follow up   Sleep apnea     PAST SURGICAL HISTORY: Past Surgical History:  Procedure Laterality Date   MULTIPLE TOOTH EXTRACTIONS     NASAL SEPTOPLASTY W/ TURBINOPLASTY Bilateral 01/29/2019   Procedure: NASAL SEPTOPLASTY WITH BILATERAL TURBINATE REDUCTION;  Surgeon: Drema Halon, MD;  Location: [facility] OR;  Service: ENT;  Laterality: Bilateral;   NASAL SINUS SURGERY Bilateral 01/29/2019   Procedure: ENDOSCOPIC SINUS SURGERY;  Surgeon: Drema Halon, MD;  Location: [facility] OR;  Service: ENT;  Laterality: Bilateral;   POLYPECTOMY Right 01/29/2019   Procedure: RIGHT NASAL POLYPECTOMY;  Surgeon: Drema Halon, MD;  Location: [facility] OR;  Service: ENT;  Laterality: Right;   WISDOM TOOTH EXTRACTION      FAMILY HISTORY: Family History  Problem Relation Age of Onset   Hypertension Mother    Sleep apnea Mother    Obesity Mother    Obesity Father    Sleep apnea Father    Hypertension Father    Hyperlipidemia Father    Alcoholism Father    Stomach cancer Maternal Grandfather    Colon cancer Paternal Grandfather     SOCIAL HISTORY: Social History   Socioeconomic History   Marital status: Divorced    Spouse name: [name]   Number of children: 0   Years of education: Automotive engineer   Highest education level: Associate degree: academic program  Occupational History  Occupation: Art gallery manager.     Comment: [employer]   Occupation: Neurosurgeon  Tobacco Use   Smoking status: Never   Smokeless tobacco: Never  Vaping Use   Vaping status: Never Used  Substance and Sexual Activity   Alcohol use: Yes    Comment: occ.   Drug use: No   Sexual activity: Yes    Partners: Female    Comment: with monogamous wife  Other Topics Concern   Not on file  Social History Narrative   Diet: Average American diet: breakfast is cereal or oatmeal, lunch is typical a lean cuisine, dinner is typical spaghetti, meat and sides, tacos, and  hamburgers. Eats veggies. Does not eat a lot of fruit. Drinks mostly diet sodas and sweet tea. Trying to drink more water.       Exercise: He is exercising 2-3 days a week. Goes to the park and does brisk walking for about an hour. Trying to increase it to 5 days a week.       Patient is currently going though a divorce   Social Determinants of Health   Financial Resource Strain: Low Risk  (04/21/2022)   Received from [facility], [facility]   Overall Financial Resource Strain (CARDIA)    Difficulty of Paying Living Expenses: Not hard at all  Food Insecurity: No Food Insecurity (04/21/2022)   Received from [facility], [facility]   Hunger Vital Sign    Worried About Running Out of Food in the Last Year: Never true    Ran Out of Food in the Last Year: Never true  Transportation Needs: No Transportation Needs (11/08/2017)   PRAPARE - Administrator, Civil Service (Medical): No    Lack of Transportation (Non-Medical): No  Physical Activity: Insufficiently Active (04/21/2022)   Received from [facility], [facility]   Exercise Vital Sign    Days of Exercise per Week: 3 days    Minutes of Exercise per Session: 30 min  Stress: No Stress Concern Present (04/21/2022)   Received from [facility], [facility] of Occupational [facility] - Occupational Stress Questionnaire    Feeling of Stress : Only a little  Social Connections: Unknown (05/05/2023)   Received from [facility], [facility]   Social Network    Social Network: Not on file  Intimate Partner Violence: Unknown (05/05/2023)   Received from [facility], [facility]   HITS    Physically Hurt: Not on file    Insult or Talk Down To: Not on file    Threaten Physical Harm: Not on file    Scream or Curse: Not on file      PHYSICAL EXAM Generalized: Well developed, in no acute distress   Neurological examination  Mentation: Alert oriented to time, place, history taking. Follows  all commands speech and language fluent Cranial nerve II-XII: Facial symmetry noted  DIAGNOSTIC DATA (LABS, IMAGING, TESTING) - I reviewed patient records, labs, notes, testing and imaging myself where available.  Lab Results  Component Value Date   WBC 7.4 02/06/2023   HGB 14.7 02/06/2023   HCT 43.8 02/06/2023   MCV 88 02/06/2023   PLT 282 02/06/2023      Component Value Date/Time   NA 140 10/20/2023 0835   K 4.3 10/20/2023 0835   CL 104 10/20/2023 0835   CO2 22 10/20/2023 0835   GLUCOSE 84 10/20/2023 0835   GLUCOSE 82 01/22/2019 1533   BUN 18 10/20/2023  0835   CREATININE 0.95 10/20/2023 0835   CALCIUM 9.5 10/20/2023 0835   PROT 6.9 10/20/2023 0835   ALBUMIN 4.2 10/20/2023 0835   AST 19 10/20/2023 0835   ALT 17 10/20/2023 0835   ALKPHOS 65 10/20/2023 0835   BILITOT 0.4 10/20/2023 0835   GFRNONAA >60 01/22/2019 1533   GFRAA >60 01/22/2019 1533   Lab Results  Component Value Date   CHOL 156 02/06/2023   HDL 40 02/06/2023   LDLCALC 92 02/06/2023   TRIG 135 02/06/2023   CHOLHDL 4.1 11/08/2017   Lab Results  Component Value Date   HGBA1C 4.9 10/20/2023   Lab Results  Component Value Date   VITAMINB12 531 02/06/2023   Lab Results  Component Value Date   TSH 1.610 02/06/2023      ASSESSMENT AND PLAN 37 y.o. year old male  has a past medical history of Allergies, History of kidney stones, OSA on CPAP, Palpitations, Pericardial cyst, and Sleep apnea. here with:  OSA on CPAP  CPAP compliance excellent Residual AHI is good Encouraged patient to continue using CPAP nightly and > 4 hours each night F/U in 1 year or sooner if needed    Butch [name], MSN, NP-C 11/27/2023, 8:22 AM [facility] [address] [place], [place] [address] [phone]

## 2023-11-27 ENCOUNTER — Telehealth: Payer: Managed Care, Other (non HMO) | Admitting: Adult Health

## 2023-11-27 DIAGNOSIS — G4733 Obstructive sleep apnea (adult) (pediatric): Secondary | ICD-10-CM | POA: Diagnosis not present

## 2023-11-27 NOTE — Telephone Encounter (Signed)
Faxed over form to [place] regarding the Zepbound prior authorization. Waiting for them to respond.

## 2023-12-04 ENCOUNTER — Telehealth (INDEPENDENT_AMBULATORY_CARE_PROVIDER_SITE_OTHER): Payer: Self-pay | Admitting: Family Medicine

## 2023-12-04 ENCOUNTER — Other Ambulatory Visit: Payer: Self-pay | Admitting: Family Medicine

## 2023-12-04 DIAGNOSIS — E66813 Obesity, class 3: Secondary | ICD-10-CM

## 2023-12-04 NOTE — Telephone Encounter (Signed)
Fax came in from [place] with approval of patients Zepbound. Patient is notified.

## 2023-12-04 NOTE — Telephone Encounter (Signed)
Patient has been notified

## 2023-12-04 NOTE — Telephone Encounter (Signed)
Phone call to patient to let him know that per Dr. Cathey Endow it is ok to continue his current dose of the 7.5 mg Zepbound. He will call us if there are any issues at the pharmacy.

## 2023-12-04 NOTE — Telephone Encounter (Signed)
12/12 Pt called in stating his insurance has approved his Zepbound and the pt states he would like a prescription sent to the pharmacy and he would like someone to reach out to him to let him know what dosage he should be taking as well.

## 2023-12-15 ENCOUNTER — Ambulatory Visit: Payer: Managed Care, Other (non HMO) | Admitting: Family Medicine

## 2023-12-15 ENCOUNTER — Encounter: Payer: Self-pay | Admitting: Family Medicine

## 2023-12-15 VITALS — BP 129/84 | HR 60 | Temp 98.0°F | Ht 67.0 in | Wt 266.0 lb

## 2023-12-15 DIAGNOSIS — G4733 Obstructive sleep apnea (adult) (pediatric): Secondary | ICD-10-CM

## 2023-12-15 DIAGNOSIS — E66813 Obesity, class 3: Secondary | ICD-10-CM

## 2023-12-15 DIAGNOSIS — Z6841 Body Mass Index (BMI) 40.0 and over, adult: Secondary | ICD-10-CM | POA: Diagnosis not present

## 2023-12-15 MED ORDER — ZEPBOUND 7.5 MG/0.5ML ~~LOC~~ SOAJ
7.5000 mg | SUBCUTANEOUS | 0 refills | Status: DC
Start: 2023-12-15 — End: 2024-01-19

## 2023-12-15 MED ORDER — ZEPBOUND 7.5 MG/0.5ML ~~LOC~~ SOAJ
7.5000 mg | SUBCUTANEOUS | 0 refills | Status: DC
Start: 2023-12-15 — End: 2023-12-15

## 2023-12-15 NOTE — Assessment & Plan Note (Signed)
He reports good compliance with CPAP nightly He aim for 7-8 hrs of sleep at night but this has varied lately He has had some complaints of fatigue Stress levels have been higher  Continue active plan for weight loss and aim for a good 8 hrs of sleep nightly with CPAP

## 2023-12-15 NOTE — Progress Notes (Signed)
Office: [phone]  /  Fax: [phone]  WEIGHT SUMMARY AND BIOMETRICS  Starting Date: 02/06/03  Starting Weight: 291lb   Weight Lost Since Last Visit: 1lb   Vitals Temp: 98 F (36.7 C) BP: 129/84 Pulse Rate: 60 SpO2: 98 %   Body Composition  Body Fat %: 35.8 % Fat Mass (lbs): 95.2 lbs Muscle Mass (lbs): 162.4 lbs Total Body Water (lbs): 125 lbs Visceral Fat Rating : 20    HPI  Chief Complaint: OBESITY  [name] is here to discuss his progress with his obesity treatment plan. He is on the the Category 4 Plan and states he is following his eating plan approximately 50 % of the time. He states he is exercising 60 minutes 1 times per week.   Interval History:  Since last office visit he is down 1 lb He did have a 2 week gap off Zepbound for insurance purposes and had some increase in hunger He did have a little heartburn and nausea when he resumed Zepbound 7.5 mg weekly- used Pepcid AC with relief He has had more stressors and less time to get to the gym He will have some time off for the holidays He has a net weight loss of 25 lb in the past 10 mos He plans to get back on track with workouts and dietary logging  Pharmacotherapy: Zepbound 7.5 mg weekly  PHYSICAL EXAM:  Blood pressure 129/84, pulse 60, temperature 98 F (36.7 C), height 5\' 7"  (1.702 m), weight 266 lb (120.7 kg), SpO2 98%. Body mass index is 41.66 kg/m.  General: He is overweight, cooperative, alert, well developed, and in no acute distress. PSYCH: Has normal mood, affect and thought process.   Lungs: Normal breathing effort, no conversational dyspnea.   ASSESSMENT AND PLAN  TREATMENT PLAN FOR OBESITY:  Recommended Dietary Goals  [name] is currently in the action stage of change. As such, his goal is to continue weight management plan. He has agreed to following a lower carbohydrate, vegetable and lean protein rich diet plan. Aim for 120-150 g of dietary protein daily Examples  provided on AVS  Behavioral Intervention  We discussed the following Behavioral Modification Strategies today: increasing lean protein intake to established goals, increasing vegetables, increasing water intake , work on meal planning and preparation, work on tracking and journaling calories using tracking application, keeping healthy foods at home, planning for success, and continue to work on maintaining a reduced calorie state, getting the recommended amount of protein, incorporating whole foods, making healthy choices, staying well hydrated and practicing mindfulness when eating..  Additional resources provided today: NA  Recommended Physical Activity Goals  [name] has been advised to work up to 150 minutes of moderate intensity aerobic activity a week and strengthening exercises 2-3 times per week for cardiovascular health, weight loss maintenance and preservation of muscle mass.   He has agreed to Increase the intensity, frequency or duration of strengthening exercises   Pharmacotherapy changes for the treatment of obesity: none  ASSOCIATED CONDITIONS ADDRESSED TODAY  OSA on CPAP Assessment & Plan: He reports good compliance with CPAP nightly He aim for 7-8 hrs of sleep at night but this has varied lately He has had some complaints of fatigue Stress levels have been higher  Continue active plan for weight loss and aim for a good 8 hrs of sleep nightly with CPAP   Class 3 severe obesity due to excess calories with body mass index (BMI) of 40.0 to 44.9 in adult, unspecified whether serious comorbidity  present (HCC) -     Zepbound; Inject 7.5 mg into the skin once a week.  Dispense: 2 mL; Refill: 0      He was informed of the importance of frequent follow up visits to maximize his success with intensive lifestyle modifications for his multiple health conditions.   ATTESTASTION STATEMENTS:  Reviewed by clinician on day of visit: allergies, medications, problem list, medical  history, surgical history, family history, social history, and previous encounter notes pertinent to obesity diagnosis.   I have personally spent 30 minutes total time today in preparation, patient care, nutritional counseling and documentation for this visit, including the following: review of clinical lab tests; review of medical tests/procedures/services.      Jerry Brink, DO DABFM, DABOM [facility] [address] [place], [place] [address] [phone]

## 2023-12-15 NOTE — Patient Instructions (Signed)
Aim for 120-150 g of protein daily  Good ways to keep calories lower and protein up: Eggs + liquid egg white Cottage cheese/ plain greek yogurt Protein shakes/ protein powder/ PB Fit Chicken breast/ tuna/ turkey/ lean ground beef/ flank steak Protein bars [place]

## 2023-12-30 ENCOUNTER — Telehealth: Payer: Self-pay | Admitting: *Deleted

## 2023-12-30 NOTE — Telephone Encounter (Signed)
 Prior authorization done via cover my meds for patients Zepbound. PA was approved.  Outcome Approved today by [facility] ESI 2017 [id number];Review Type:Prior Auth;Coverage Start Date:11/30/2023;Coverage End Date:08/26/2024;

## 2024-01-01 ENCOUNTER — Telehealth: Payer: Self-pay | Admitting: Family Medicine

## 2024-01-01 NOTE — Telephone Encounter (Signed)
 Faxed over Plan limit Exception form to [facility] for patients Zepbound prescription.  Waiting on determination.

## 2024-01-01 NOTE — Telephone Encounter (Signed)
 Pt is calling because he is having issues with Zepbound. The insurance said its an issue with the qty and stated he can't get the Rx until next year.

## 2024-01-19 ENCOUNTER — Ambulatory Visit: Payer: Managed Care, Other (non HMO) | Admitting: Family Medicine

## 2024-01-19 ENCOUNTER — Encounter: Payer: Self-pay | Admitting: Family Medicine

## 2024-01-19 VITALS — BP 132/82 | HR 54 | Temp 97.8°F | Ht 67.0 in | Wt 268.0 lb

## 2024-01-19 DIAGNOSIS — Z6841 Body Mass Index (BMI) 40.0 and over, adult: Secondary | ICD-10-CM | POA: Diagnosis not present

## 2024-01-19 DIAGNOSIS — E88819 Insulin resistance, unspecified: Secondary | ICD-10-CM

## 2024-01-19 DIAGNOSIS — E66813 Obesity, class 3: Secondary | ICD-10-CM

## 2024-01-19 DIAGNOSIS — G4733 Obstructive sleep apnea (adult) (pediatric): Secondary | ICD-10-CM | POA: Diagnosis not present

## 2024-01-19 MED ORDER — QSYMIA 3.75-23 MG PO CP24: ORAL_CAPSULE | ORAL | 0 refills | Status: DC

## 2024-01-19 MED ORDER — QSYMIA 7.5-46 MG PO CP24: ORAL_CAPSULE | ORAL | 0 refills | Status: DC

## 2024-01-19 NOTE — Assessment & Plan Note (Signed)
He reports good compliance using CPAP nightly for severe OSA, managed by Dr. Vickey Huger.  He is doing well with weight reduction.  Continue to work on adequate sleep with a goal of 7 to 8 hours each night wearing CPAP Continue to work on weight reduction

## 2024-01-19 NOTE — Progress Notes (Signed)
Office: [phone]  /  Fax: [phone]  WEIGHT SUMMARY AND BIOMETRICS  Starting Date: 02/06/23  Starting Weight: 291lb   Weight Lost Since Last Visit: 0lb   Vitals Temp: 97.8 F (36.6 C) BP: 132/82 Pulse Rate: (!) 54 SpO2: 99 %   Body Composition  Body Fat %: 35.9 % Fat Mass (lbs): 96.2 lbs Muscle Mass (lbs): 163.6 lbs Total Body Water (lbs): 129.4 lbs Visceral Fat Rating : 20     HPI  Chief Complaint: OBESITY  [name] is here to discuss his progress with his obesity treatment plan. He is on the the Category 4 Plan and states he is following his eating plan approximately 75 % of the time. He states he is exercising 60 minutes 2 times per week.   Interval History:  Since last office visit he is up 2 lb He is up 1.2 pounds of muscle mass and up 1 pound of body fat in the past month For insurance reasons, he ran out of Zepobund 2 weeks ago and has been a bit Doctor, general practice cravings are OK but he is fighting with boredom eating He is keeping healthy low cal snacks at home He is working out 2 days/ wk - doing mostly weight training and 25 min of walking He is wearing his CPAP at night He has been intolerant to metformin with dizziness He is down 23 lb in 11 mos This is a 7.9% total body weight loss using Zepbound in combination with a reduced calorie diet and regular exercise, 2 days a week consistently  Pharmacotherapy: None  PHYSICAL EXAM:  Blood pressure 132/82, pulse (!) 54, temperature 97.8 F (36.6 C), height 5\' 7"  (1.702 m), weight 268 lb (121.6 kg), SpO2 99%. Body mass index is 41.97 kg/m.  General: He is overweight, cooperative, alert, well developed, and in no acute distress. PSYCH: Has normal mood, affect and thought process.   Lungs: Normal breathing effort, no conversational dyspnea.  ASSESSMENT AND PLAN  TREATMENT PLAN FOR OBESITY:  Recommended Dietary Goals  [name] is currently in the action stage of change. As such, his goal is to  continue weight management plan. He has agreed to the Category 4 Plan.  Behavioral Intervention  We discussed the following Behavioral Modification Strategies today: increasing lean protein intake to established goals, increasing fiber rich foods, increasing water intake , work on meal planning and preparation, work on Counselling psychologist calories using tracking application, keeping healthy foods at home, work on managing stress, creating time for self-care and relaxation, avoiding temptations and identifying enticing environmental cues, planning for success, and continue to work on maintaining a reduced calorie state, getting the recommended amount of protein, incorporating whole foods, making healthy choices, staying well hydrated and practicing mindfulness when eating..  Additional resources provided today: NA  Recommended Physical Activity Goals  [name] has been advised to work up to 150 minutes of moderate intensity aerobic activity a week and strengthening exercises 2-3 times per week for cardiovascular health, weight loss maintenance and preservation of muscle mass.   He has agreed to Increase the intensity, frequency or duration of strengthening exercises   Pharmacotherapy changes for the treatment of obesity: Discontinue Zepbound due to lack of insurance coverage Begin Qsymia 3.75/23 mg each morning x 14 days then increase to 7.5/46 mg once daily with breakfast Reviewed mechanism of action and potential adverse side effects of Qsymia.  PDMP reviewed.  Informed consent signed. Will use Qsymia in combination with a reduced calorie prescribed dietary plan  and regular exercise  ASSOCIATED CONDITIONS ADDRESSED TODAY  Insulin resistance Assessment & Plan: Improving.  Fasting insulin level has improved.  He has been intolerant to use of metformin He has lost 7.9% of his total body weight in the past 11 months of medically supervised weight management, using Zepbound in combination  with a reduced calorie diet He is getting in 2 days a week of resistance training  Plan to increase walking time over the next 3 months Continue active plan for weight reduction     Class 3 severe obesity due to excess calories with body mass index (BMI) of 40.0 to 44.9 in adult, unspecified whether serious comorbidity present (HCC) -     Qsymia; 1 capsule po qAM  Dispense: 14 capsule; Refill: 0 -     Qsymia; 1 capsule q AM  Dispense: 30 capsule; Refill: 0  OSA on CPAP Assessment & Plan: He reports good compliance using CPAP nightly for severe OSA, managed by Dr. Vickey Huger.  He is doing well with weight reduction.  Continue to work on adequate sleep with a goal of 7 to 8 hours each night wearing CPAP Continue to work on weight reduction       He was informed of the importance of frequent follow up visits to maximize his success with intensive lifestyle modifications for his multiple health conditions.   ATTESTASTION STATEMENTS:  Reviewed by clinician on day of visit: allergies, medications, problem list, medical history, surgical history, family history, social history, and previous encounter notes pertinent to obesity diagnosis.   I have personally spent 30 minutes total time today in preparation, patient care, nutritional counseling and documentation for this visit, including the following: review of clinical lab tests; review of medical tests/procedures/services.      Glennis Brink, DO DABFM, DABOM [facility] [address] [place], [place] [address] [phone]

## 2024-01-19 NOTE — Assessment & Plan Note (Signed)
Improving.  Fasting insulin level has improved.  He has been intolerant to use of metformin He has lost 7.9% of his total body weight in the past 11 months of medically supervised weight management, using Zepbound in combination with a reduced calorie diet He is getting in 2 days a week of resistance training  Plan to increase walking time over the next 3 months Continue active plan for weight reduction

## 2024-01-19 NOTE — Patient Instructions (Signed)
[email or web address]

## 2024-01-30 ENCOUNTER — Encounter: Payer: Self-pay | Admitting: Family

## 2024-01-30 ENCOUNTER — Ambulatory Visit: Payer: Managed Care, Other (non HMO) | Admitting: Family

## 2024-01-30 VITALS — BP 134/82 | HR 69 | Ht 67.0 in | Wt 276.8 lb

## 2024-01-30 DIAGNOSIS — K625 Hemorrhage of anus and rectum: Secondary | ICD-10-CM | POA: Diagnosis not present

## 2024-01-30 DIAGNOSIS — J309 Allergic rhinitis, unspecified: Secondary | ICD-10-CM | POA: Diagnosis not present

## 2024-01-30 DIAGNOSIS — Q248 Other specified congenital malformations of heart: Secondary | ICD-10-CM

## 2024-01-30 NOTE — Progress Notes (Signed)
 [name] is a 38 y.o. male with the following history as recorded in Epic[employer]:  Patient Active Problem List   Diagnosis Date Noted   Other fatigue 11/18/2023   Insulin  resistance 03/31/2023   Low testosterone  in male 03/31/2023   Elevated ALT measurement 03/31/2023   Vitamin D  deficiency 03/31/2023   Visceral obesity 01/08/2023   OSA on CPAP 01/08/2023   Common wart 11/21/2018   Obstructive sleep apnea treated with continuous positive airway pressure (CPAP) 04/27/2018   Morbid obesity (HCC) with starting BMI 45 12/17/2017    Current Outpatient Medications  Medication Sig Dispense Refill   amoxicillin-clavulanate (AUGMENTIN) 875-125 MG tablet Take 1 tablet by mouth 2 (two) times daily.     cetirizine  (ZYRTEC ) 10 MG tablet Take 10 mg by mouth daily as needed.      CLOMID 50 MG tablet Take 50 mg by mouth daily.     Magnesium 100 MG CAPS      Multiple Vitamin (ONE-A-DAY MENS PO)      ofloxacin (FLOXIN) 0.3 % OTIC solution Place 5 drops into the left ear 2 (two) times daily.     Phentermine-Topiramate (QSYMIA ) 3.75-23 MG CP24 1 capsule po qAM 14 capsule 0   Phentermine-Topiramate (QSYMIA ) 7.5-46 MG CP24 1 capsule q AM 30 capsule 0   predniSONE (DELTASONE) 10 MG tablet Take 40 mg by mouth daily.     No current facility-administered medications for this visit.    Allergies: Metformin  and related and Vicks vaporub [[id number]-eucalypt-men-turp-pet]  Past Medical History:  Diagnosis Date   Allergies    History of kidney stones    OSA on CPAP    Palpitations    Pericardial cyst    dx from Ct Abdomen 2013 - no follow up   Sleep apnea     Past Surgical History:  Procedure Laterality Date   MULTIPLE TOOTH EXTRACTIONS     NASAL SEPTOPLASTY W/ TURBINOPLASTY Bilateral 01/29/2019   Procedure: NASAL SEPTOPLASTY WITH BILATERAL TURBINATE REDUCTION;  Surgeon: Ethyl Lonni BRAVO, MD;  Location: [facility] OR;  Service: ENT;  Laterality: Bilateral;   NASAL SINUS SURGERY Bilateral 01/29/2019    Procedure: ENDOSCOPIC SINUS SURGERY;  Surgeon: Ethyl Lonni BRAVO, MD;  Location: [facility] OR;  Service: ENT;  Laterality: Bilateral;   POLYPECTOMY Right 01/29/2019   Procedure: RIGHT NASAL POLYPECTOMY;  Surgeon: Ethyl Lonni BRAVO, MD;  Location: [facility] OR;  Service: ENT;  Laterality: Right;   WISDOM TOOTH EXTRACTION      Family History  Problem Relation Age of Onset   Hypertension Mother    Sleep apnea Mother    Obesity Mother    Obesity Father    Sleep apnea Father    Hypertension Father    Hyperlipidemia Father    Alcoholism Father    Stomach cancer Maternal Grandfather    Colon cancer Paternal Grandfather     Social History   Tobacco Use   Smoking status: Never   Smokeless tobacco: Never  Substance Use Topics   Alcohol use: Yes    Comment: occ.    Subjective:   Presents today as a new patient; already established with a number of specialists including urology/ weight loss specialist;  Would like to see allergist to discuss testing;  Was told that in the past did have epicardial cyst- noted on CT in 2013;   Paternal grandfather passed away with colon cancer; ( thinks he was in his 38s when he passes away); does note that he has had occasional issues with noticing bright  red blood on tissue while wiping; was more noticeable while on Zepbound / has not noticed since stopping medication/ increasing fiber intake;      Objective:  Vitals:   01/30/24 0857  BP: 134/82  Pulse: 69  SpO2: 98%  Weight: 276 lb 12.8 oz (125.6 kg)  Height: 5' 7 (1.702 m)    General: Well developed, well nourished, in no acute distress  Skin : Warm and dry.  Head: Normocephalic and atraumatic  Eyes: Sclera and conjunctiva clear; pupils round and reactive to light; extraocular movements intact  Ears: External normal; canals clear; tympanic membranes normal  Oropharynx: Pink, supple. No suspicious lesions  Neck: Supple without thyromegaly, adenopathy  Lungs: Respirations unlabored; clear to  auscultation bilaterally without wheeze, rales, rhonchi  CVS exam: normal rate and regular rhythm.  Neurologic: Alert and oriented; speech intact; face symmetrical; moves all extremities well; CNII-XII intact without focal deficit   Assessment:  1. Allergic rhinitis, unspecified seasonality, unspecified trigger   2. Rectal bleeding   3. Congenital epicardial cyst     Plan:  Will refer to allergy  for testing; may also need to consider ENT referral due to recurrent ear infections; follow up to be determined; Suspect due to hemorrhoids/ constipation but due to FH of colon cancer, do think he would qualify for earlier colonoscopy; referral updated; Incidental finding noted on CT in 2013; due to numerous options for management including simple CXR vs MRI vs echo, will have patient establish with cardiologist to discuss best management options going forward; patient is not having any symptoms and blood pressure stable;   He is having regular labs/ follow up visit with his weight loss provider; will plan for 1 year follow up/ prn as needed otherwise.   No follow-ups on file.  Orders Placed This Encounter  Procedures   Ambulatory referral to Allergy     Referral Priority:   Routine    Referral Type:   Allergy  Testing    Referral Reason:   Specialty Services Required    Requested Specialty:   Allergy     Number of Visits Requested:   1   Ambulatory referral to Gastroenterology    Referral Priority:   Routine    Referral Type:   Consultation    Referral Reason:   Specialty Services Required    Number of Visits Requested:   1   Ambulatory referral to Cardiology    Referral Priority:   Routine    Referral Type:   Consultation    Referral Reason:   Specialty Services Required    Number of Visits Requested:   1    Requested Prescriptions    No prescriptions requested or ordered in this encounter

## 2024-02-19 ENCOUNTER — Ambulatory Visit: Payer: Managed Care, Other (non HMO) | Admitting: Family Medicine

## 2024-02-19 ENCOUNTER — Encounter: Payer: Self-pay | Admitting: Family Medicine

## 2024-02-19 VITALS — BP 131/83 | HR 60 | Temp 98.3°F | Ht 67.0 in | Wt 265.0 lb

## 2024-02-19 DIAGNOSIS — Z6841 Body Mass Index (BMI) 40.0 and over, adult: Secondary | ICD-10-CM

## 2024-02-19 DIAGNOSIS — H5310 Unspecified subjective visual disturbances: Secondary | ICD-10-CM | POA: Insufficient documentation

## 2024-02-19 DIAGNOSIS — G4733 Obstructive sleep apnea (adult) (pediatric): Secondary | ICD-10-CM | POA: Diagnosis not present

## 2024-02-19 DIAGNOSIS — E88819 Insulin resistance, unspecified: Secondary | ICD-10-CM

## 2024-02-19 DIAGNOSIS — E66813 Obesity, class 3: Secondary | ICD-10-CM | POA: Diagnosis not present

## 2024-02-19 NOTE — Progress Notes (Signed)
 Office: [phone]  /  Fax: [phone]  WEIGHT SUMMARY AND BIOMETRICS  Starting Date: 02/06/23  Starting Weight: 291lb   Weight Lost Since Last Visit: 3lb   Vitals Temp: 98.3 F (36.8 C) BP: 131/83 Pulse Rate: 60 SpO2: 97 %   Body Composition  Body Fat %: 35.1 % Fat Mass (lbs): 93 lbs Muscle Mass (lbs): 163.8 lbs Total Body Water (lbs): 124.6 lbs Visceral Fat Rating : 19   HPI  Chief Complaint: OBESITY  Elwin is here to discuss his progress with his obesity treatment plan. He is on the the Category 4 Plan and states he is following his eating plan approximately 85 % of the time. He states he is exercising 60-120 minutes 2 times per week.  Interval History:  Since last office visit he is down 3 lb He is up 0.2 pounds of muscle mass and down 3.2 pounds of body fat since his last visit This gives him a net weight loss of 26 pounds in the past 1 year medically supervised weight management This is an 8.9% total body weight loss He has stayed off of Qsymia due to eye strain that started soon after he started He is planning to see an eye doctor soon Denies blurry vision or headache He is mindful of his food choices and portion sizes with slight increase in appetite off of Zepbound He plans to start Factor meals to help with meal planning He is getting in some fruits and veggies He is logging calorie intake with 1600- 2000 cal/ day He has been struggling with sleep issues (using CPAP nightly)  Pharmacotherapy: none Previously had dizziness from metformin Previously had eyestrain from Qsymia Zepbound no longer covered by insurance  PHYSICAL EXAM:  Blood pressure 131/83, pulse 60, temperature 98.3 F (36.8 C), height 5\' 7"  (1.702 m), weight 265 lb (120.2 kg), SpO2 97%. Body mass index is 41.5 kg/m.  General: He is overweight, cooperative, alert, well developed, and in no acute distress. PSYCH: Has normal mood, affect and thought process.   Lungs: Normal  breathing effort, no conversational dyspnea.  ASSESSMENT AND PLAN  TREATMENT PLAN FOR OBESITY:  Recommended Dietary Goals  Kalman is currently in the action stage of change. As such, his goal is to continue weight management plan. He has agreed to keeping a food journal and adhering to recommended goals of getting 1800-2200 calories and 120 to 150 g of protein and practicing portion control and making smarter food choices, such as increasing vegetables and decreasing simple carbohydrates.  Behavioral Intervention  We discussed the following Behavioral Modification Strategies today: increasing lean protein intake to established goals, increasing fiber rich foods, increasing water intake , work on meal planning and preparation, work on tracking and journaling calories using tracking application, keeping healthy foods at home, decreasing eating out or consumption of processed foods, and making healthy choices when eating convenient foods, work on managing stress, creating time for self-care and relaxation, avoiding temptations and identifying enticing environmental cues, planning for success, and continue to work on maintaining a reduced calorie state, getting the recommended amount of protein, incorporating whole foods, making healthy choices, staying well hydrated and practicing mindfulness when eating.Marland Kitchen He will be using this after meals to help with meal planning/meal prep for lunches and dinners  Additional resources provided today: NA  Recommended Physical Activity Goals  Moussa has been advised to work up to 150 minutes of moderate intensity aerobic activity a week and strengthening exercises 2-3 times per week for cardiovascular  health, weight loss maintenance and preservation of muscle mass.   He has agreed to Increase the intensity, frequency or duration of aerobic exercises   and Work on scheduling and tracking physical activity.  Currently walking on the treadmill 20 minutes 2 days a  week with a sedentary job  Recommend weight training 2 to 3 days a week and increasing walking time to at least 30 minutes 5 days a week  Pharmacotherapy changes for the treatment of obesity: None  ASSOCIATED CONDITIONS ADDRESSED TODAY  OSA on CPAP He reports good compliance with use of his CPAP nightly though his tracking app is reporting poor deep sleep.  He has had more daytime somnolence.  He denies any acute stressors.  His CPAP mask seems to be fitting well even with his 26 pounds of weight loss.  He is scheduled for neurology follow-up with Everlene Other, NP 4/1.  Keep upcoming visit as scheduled with neurology.  Continue routine use of the CPAP aiming for 7 to 8 hours of sleep at night, working on stress reduction.  Recommend increase walking time to least 30 minutes 5 days a week to potentially help with sleep  Class 3 severe obesity due to excess calories with body mass index (BMI) of 40.0 to 44.9 in adult, unspecified whether serious comorbidity present [facility]) Patient has had multiple different side effects from medication.  At this point in time, he agrees to not using any antiobesity medications.  His insurance does not cover GLP-1 receptor agonist.  He had the best results from Zepbound.  He is able to practice mindful eating portion control and regular exercise.  Will move his visits out to every 2 months.  Recommend tracking daily caloric intake.  Recommend increasing daily calorie goal to 1800 to 2200 cal/day.  Aim for at least 120 g of dietary protein intake daily.  Ramp up cardio and keep resistance training at 2-3 times a week  Insulin resistance Last fasting insulin has improved from 67.3-22.8 with reducing intake of added sugar and refined carbohydrates.  He has also been fairly consistent with more regular exercise.  He had dizziness for metformin.  Continue to work on avoiding high sugar foods and drinks, limiting intake of starches and increasing walking time  Eye  strain [place].  She reported having eyestrain with the first week of Qsymia 3.75/23 mg once daily.  He message me via MyChart and he immediately discontinue Qsymia.  He was not having issues with headaches or blurry vision.  His eyestrain has somewhat improved.  He is set up to see an allergist for complaints of watery eye and will have his eye exam this weekend.  He does stare at a computer monitor daily.  Will keep him off Qsymia and keep upcoming visit with eye doctor.     He was informed of the importance of frequent follow up visits to maximize his success with intensive lifestyle modifications for his multiple health conditions.   ATTESTASTION STATEMENTS:  Reviewed by clinician on day of visit: allergies, medications, problem list, medical history, surgical history, family history, social history, and previous encounter notes pertinent to obesity diagnosis.   I have personally spent 30 minutes total time today in preparation, patient care, nutritional counseling and documentation for this visit, including the following: review of clinical lab tests; review of medical tests/procedures/services.      Glennis Brink, DO DABFM, DABOM [facility] [address] [place], [place] [address] [phone]

## 2024-03-02 ENCOUNTER — Encounter: Payer: Self-pay | Admitting: Family

## 2024-03-02 ENCOUNTER — Other Ambulatory Visit: Payer: Self-pay | Admitting: Family

## 2024-03-02 ENCOUNTER — Ambulatory Visit: Payer: Managed Care, Other (non HMO) | Admitting: Internal Medicine

## 2024-03-02 ENCOUNTER — Other Ambulatory Visit: Payer: Self-pay

## 2024-03-02 ENCOUNTER — Encounter: Payer: Self-pay | Admitting: Internal Medicine

## 2024-03-02 VITALS — BP 130/82 | HR 66 | Temp 97.9°F | Ht 66.93 in | Wt 265.0 lb

## 2024-03-02 DIAGNOSIS — J3089 Other allergic rhinitis: Secondary | ICD-10-CM

## 2024-03-02 DIAGNOSIS — Z8709 Personal history of other diseases of the respiratory system: Secondary | ICD-10-CM | POA: Diagnosis not present

## 2024-03-02 DIAGNOSIS — J343 Hypertrophy of nasal turbinates: Secondary | ICD-10-CM | POA: Diagnosis not present

## 2024-03-02 DIAGNOSIS — Z113 Encounter for screening for infections with a predominantly sexual mode of transmission: Secondary | ICD-10-CM

## 2024-03-02 DIAGNOSIS — J328 Other chronic sinusitis: Secondary | ICD-10-CM

## 2024-03-02 MED ORDER — CETIRIZINE HCL 10 MG PO TABS: 10.0000 mg | ORAL_TABLET | Freq: Two times a day (BID) | ORAL | 5 refills | Status: DC

## 2024-03-02 MED ORDER — FLUTICASONE PROPIONATE 50 MCG/ACT NA SUSP: 2.0000 | Freq: Every day | NASAL | 5 refills | Status: DC

## 2024-03-02 NOTE — Patient Instructions (Addendum)
 Other Allergic Rhinitis: Chronic Sinusitis, History of Nasal Polyps s/p surgery 2020  - Use nasal saline rinses before nose sprays such as with Neilmed Sinus Rinse.  Use distilled water.   - Use Flonase 2 sprays each nostril daily. Aim upward and outward. - Use Zyrtec 10 mg twice daily.   Hold all anti-histamines (Xyzal, Allegra, Zyrtec, Claritin, Benadryl, Pepcid) 3 days prior to next visit.  Follow up: 830 AM on 3/18 for skin testing 1-55

## 2024-03-02 NOTE — Progress Notes (Signed)
 NEW PATIENT  Date of Service/Encounter:  03/02/24  Consult requested by: Jerry Bass, FNP   Subjective:   [name] (DOB: August 24, 1986) is a 38 y.o. male who presents to the clinic on 03/02/2024 with a chief complaint of Nasal Congestion, Nasal Polyps, Allergies, and Establish Care .    History obtained from: chart review and patient.  Rhinitis Sinusitis/Nasal Polyps  Started around age 88s.  Symptoms include: nasal congestion, rhinorrhea, and post nasal drainage, sinus pressure  Occurs year-round with seasonal flares Spring/Summer   Potential triggers: pollens  Treatments tried:  Zyrtec 10mg  BID  Saline rinses   Previous allergy testing: no History of sinus surgery: yes in 2020 nasal septoplasty, bl turbinate reduction, R nasal polypectomy, bl anterior ethmoidectomy and maxillary enlargement.    Nonallergic triggers: none   Has a hx of pericardial cyst, no cardiac dx otherwise.  No pulm dx.  Not on beta blockers.   Reviewed:  01/30/2024: seen by Jerry Scrape NP for allergic rhinitis with chronic congestion, hx of epicardial cyst.  Referred to Allergy for testing and considered possibly ENT.  04/23/2022: seen by [name] ENT Dr [name] for congestion and drainage. Scope showed open ethmoids/maxillary, no polyps, left septal deviation.  Discussed starting Zyrtec, Xhance, oral prednisone.   01/29/2019: underwent ENT surgery with Dr [name]  NASAL SEPTOPLASTY WITH BILATERAL TURBINATE REDUCTION (Bilateral) RIGHT NASAL POLYPECTOMY (Right) ENDOSCOPIC SINUS SURGERY (Bilateral) FESS with Bilateral anterior ethmoidectomy and bilateral maxillary ostia enlargement  Past Medical History: Past Medical History:  Diagnosis Date   Allergies    History of kidney stones    OSA on CPAP    Palpitations    Pericardial cyst    dx from Ct Abdomen 2013 - no follow up   Sleep apnea     Past Surgical History: Past Surgical History:  Procedure Laterality Date   MULTIPLE TOOTH  EXTRACTIONS     NASAL SEPTOPLASTY W/ TURBINOPLASTY Bilateral 01/29/2019   Procedure: NASAL SEPTOPLASTY WITH BILATERAL TURBINATE REDUCTION;  Surgeon: Jerry Halon, MD;  Location: [facility] OR;  Service: ENT;  Laterality: Bilateral;   NASAL SINUS SURGERY Bilateral 01/29/2019   Procedure: ENDOSCOPIC SINUS SURGERY;  Surgeon: Jerry Halon, MD;  Location: [facility] OR;  Service: ENT;  Laterality: Bilateral;   POLYPECTOMY Right 01/29/2019   Procedure: RIGHT NASAL POLYPECTOMY;  Surgeon: Jerry Halon, MD;  Location: [facility] OR;  Service: ENT;  Laterality: Right;   WISDOM TOOTH EXTRACTION      Family History: Family History  Problem Relation Age of Onset   Hypertension Mother    Sleep apnea Mother    Obesity Mother    Allergic rhinitis Father    Asthma Father    Obesity Father    Sleep apnea Father    Hypertension Father    Hyperlipidemia Father    Alcoholism Father    Allergic rhinitis Sister    Asthma Sister    Stomach cancer Maternal Grandfather    Colon cancer Paternal Grandfather     Social History:  Flooring in bedroom: carpet Pets: none Tobacco use/exposure: none Job: Sport and exercise psychologist   Medication List:  Allergies as of 03/02/2024       Reactions   Metformin And Related Other (See Comments)   Felt dizzy   Vicks Vaporub [camph-eucalypt-men-turp-pet] Rash        Medication List        Accurate as of March 02, 2024 10:30 AM. If you have any questions, ask your nurse or doctor.  cetirizine 10 MG tablet Commonly known as: ZYRTEC Take 1 tablet (10 mg total) by mouth 2 (two) times daily. What changed:  when to take this reasons to take this Changed by: [name]   Clomid 50 MG tablet Generic drug: clomiPHENE Take 50 mg by mouth daily.   fluticasone 50 MCG/ACT nasal spray Commonly known as: FLONASE Place 2 sprays into both nostrils daily. Started by: [name]   Magnesium 100 MG Caps   ofloxacin 0.3 % OTIC solution Commonly known as:  FLOXIN Place 5 drops into the left ear 2 (two) times daily.   ONE-A-DAY MENS PO         REVIEW OF SYSTEMS: Pertinent positives and negatives discussed in HPI.   Objective:   Physical Exam: BP 130/82   Pulse 66   Temp 97.9 F (36.6 C)   Ht 5' 6.93" (1.7 m)   Wt 265 lb (120.2 kg)   SpO2 96%   BMI 41.59 kg/m  Body mass index is 41.59 kg/m. GEN: alert, well developed HEENT: clear conjunctiva, nose with + mild inferior turbinate hypertrophy, pink nasal mucosa,+ clear rhinorrhea, + cobblestoning HEART: regular rate and rhythm, no murmur LUNGS: clear to auscultation bilaterally, no coughing, unlabored respiration ABDOMEN: soft, non distended  SKIN: no rashes or lesions   Assessment:   1. Other allergic rhinitis   2. History of nasal polyp   3. Other chronic sinusitis   4. Nasal turbinate hypertrophy     Plan/Recommendations:  Other Allergic Rhinitis: Chronic Sinusitis, History of Nasal Polyps s/p surgery 2020  - Due to turbinate hypertrophy, seasonal symptoms, hx of nasal polyps requiring sinus surgery and unresponsive to over the counter meds, will perform skin testing to identify aeroallergen triggers.   - Use nasal saline rinses before nose sprays such as with Neilmed Sinus Rinse.  Use distilled water.   - Use Flonase 2 sprays each nostril daily. Aim upward and outward. - Use Zyrtec 10 mg twice daily.   Hold all anti-histamines (Xyzal, Allegra, Zyrtec, Claritin, Benadryl, Pepcid) 3 days prior to next visit.  Follow up: 830 AM on 3/18 for skin testing 1-55, IDs okay    Jerry Morin, MD [facility]

## 2024-03-09 ENCOUNTER — Ambulatory Visit: Admitting: Internal Medicine

## 2024-03-09 DIAGNOSIS — J3089 Other allergic rhinitis: Secondary | ICD-10-CM | POA: Diagnosis not present

## 2024-03-09 DIAGNOSIS — J301 Allergic rhinitis due to pollen: Secondary | ICD-10-CM | POA: Diagnosis not present

## 2024-03-09 MED ORDER — AZELASTINE HCL 0.1 % NA SOLN: 2.0000 | Freq: Two times a day (BID) | NASAL | 5 refills | Status: DC | PRN

## 2024-03-09 NOTE — Patient Instructions (Addendum)
 Allergic Rhinitis: Chronic Sinusitis, History of Nasal Polyps s/p surgery 2020  - SPT 02/2024: positive to trees, grasses, weeds, molds - Use nasal saline rinses before nose sprays such as with Neilmed Sinus Rinse.  Use distilled water.   - Use Flonase 2 sprays each nostril daily. Aim upward and outward. - Use Azelastine 2 sprays each nostril twice daily as needed for runny nose, drainage, sneezing, congestion. Aim upward and outward. - Use Zyrtec 10 mg daily.  Okay to take an extra dose if needed.  - Consider allergy shots as long term control of your symptoms by teaching your immune system to be more tolerant of your allergy triggers   ALLERGEN AVOIDANCE MEASURES  Molds - Indoor avoidance Use air conditioning to reduce indoor humidity.  Do not use a humidifier. Keep indoor humidity at 30 - 40%.  Use a dehumidifier if needed. In the bathroom use an exhaust fan or open a window after showering.  Wipe down damp surfaces after showering.  Clean bathrooms with a mold-killing solution (diluted bleach, or products like Tilex, etc) at least once a month. In the kitchen use an exhaust fan to remove steam from cooking.  Throw away spoiled foods immediately, and empty garbage daily.  Empty water pans below self-defrosting refrigerators frequently. Vent the clothes dryer to the outside. Limit indoor houseplants; mold grows in the dirt.  No houseplants in the bedroom. Remove carpet from the bedroom. Encase the mattress and box springs with a zippered encasing.  Molds - Outdoor avoidance Avoid being outside when the grass is being mowed, or the ground is tilled. Avoid playing in leaves, pine straw, hay, etc.  Dead plant materials contain mold. Avoid going into barns or grain storage areas. Remove leaves, clippings and compost from around the home.  Pollen Avoidance Pollen levels are highest during the mid-day and afternoon.  Consider this when planning outdoor activities. Avoid being outside when  the grass is being mowed, or wear a mask if the pollen-allergic person must be the one to mow the grass. Keep the windows closed to keep pollen outside of the home. Use an air conditioner to filter the air. Take a shower, wash hair, and change clothing after working or playing outdoors during pollen season.

## 2024-03-09 NOTE — Progress Notes (Signed)
 FOLLOW UP Date of Service/Encounter:  03/09/24   Subjective:  [name] (DOB: Nov 11, 1986) is a 38 y.o. male who [facility] for follow up for skin testing.   History obtained from: chart review and patient.  Anti histamines held.   Past Medical History: Past Medical History:  Diagnosis Date   Allergies    History of kidney stones    OSA on CPAP    Palpitations    Pericardial cyst    dx from Ct Abdomen 2013 - no follow up   Sleep apnea     Objective:  There were no vitals taken for this visit. There is no height or weight on file to calculate BMI. Physical Exam: GEN: alert, well developed HEENT: clear conjunctiva, MMM LUNGS: unlabored respiration  Skin Testing:  Skin prick testing was placed, which includes aeroallergens/foods, histamine control, and saline control.  Verbal consent was obtained prior to placing test.  Patient tolerated procedure well.  Allergy testing results were read and interpreted by myself, documented by clinical staff. Adequate positive and negative control.  Positive results to:  Results discussed with patient/family.  Airborne Adult Perc - 03/09/24 0834     Time Antigen Placed 0834    Allergen Manufacturer Waynette Buttery    Location Back    Number of Test 55    2. Control-Histamine 3+    3. Bahia 3+    4. [place] 2+    5. Johnson 3+    6. [place] Blue 3+    7. Meadow Fescue 3+    8. Perennial Rye 3+    9. Timothy 3+    10. Ragweed Mix 3+    11. Cocklebur Negative    12. Plantain,  English 3+    13. Baccharis Negative    14. Dog Fennel Negative    15. Russian Thistle 3+    16. Lamb's Quarters Negative    17. Sheep Sorrell Negative    18. Rough Pigweed Negative    19. Marsh Elder, Rough Negative    20. Mugwort, Common Negative    21. Box, Elder Negative    22. Cedar, red 3+    23. Sweet Gum 3+    24. Pecan Pollen Negative    25. Pine Mix Negative    26. Walnut, Black Pollen Negative     27. Red Mulberry Negative    28. Ash Mix Negative    29. Birch Mix Negative    30. Beech American Negative    31. Cottonwood, Guinea-Bissau Negative    32. Hickory, White 3+    33. Maple Mix Negative    34. Oak, Guinea[place] Mix Negative    35. Sycamore Eastern 3+    36. Alternaria Alternata 3+    37. Cladosporium Herbarum Negative    38. Aspergillus Mix Negative    39. Penicillium Mix Negative    40. Bipolaris Sorokiniana (Helminthosporium) Negative    41. Drechslera Spicifera (Curvularia) 3+    42. Mucor Plumbeus Negative    43. Fusarium Moniliforme Negative    44. Aureobasidium Pullulans (pullulara) Negative    45. Rhizopus Oryzae Negative    46. Botrytis Cinera Negative    47. Epicoccum Nigrum Negative    48. Phoma Betae Negative    49. Dust Mite Mix Negative    50. Cat Hair 10,000 BAU/ml Negative    51.  Dog Epithelia Negative    52. Mixed Feathers Negative    53. Horse Epithelia  Negative    54. Cockroach, German Negative    55. Tobacco Leaf Negative             Intradermal - 03/09/24 0911     Time Antigen Placed 0911    Allergen Manufacturer Waynette Buttery    Location Arm    Number of Test 8    Intradermal Select    Control Negative    Weed Mix 3+    Mold 2 3+    Mold 4 2+    Mite Mix Negative    Cat Negative    Dog Negative    Cockroach Negative              Assessment:   1. Seasonal allergic rhinitis due to pollen   2. Allergic rhinitis caused by mold     Plan/Recommendations:  Allergic Rhinitis: Chronic Sinusitis, History of Nasal Polyps s/p surgery 2020  - Due to turbinate hypertrophy, seasonal symptoms, hx of nasal polyps requiring sinus surgery and unresponsive to over the counter meds, will perform skin testing to identify aeroallergen triggers.   - SPT 02/2024: positive to trees, grasses, weeds, molds - Use nasal saline rinses before nose sprays such as with Neilmed Sinus Rinse.  Use distilled water.   - Use Flonase 2 sprays each nostril daily. Aim  upward and outward. - Use Azelastine 2 sprays each nostril twice daily as needed for runny nose, drainage, sneezing, congestion. Aim upward and outward. - Use Zyrtec 10 mg daily.  Okay to take an extra dose if needed.  - Consider allergy shots as long term control of your symptoms by teaching your immune system to be more tolerant of your allergy triggers   ALLERGEN AVOIDANCE MEASURES  Molds - Indoor avoidance Use air conditioning to reduce indoor humidity.  Do not use a humidifier. Keep indoor humidity at 30 - 40%.  Use a dehumidifier if needed. In the bathroom use an exhaust fan or open a window after showering.  Wipe down damp surfaces after showering.  Clean bathrooms with a mold-killing solution (diluted bleach, or products like Tilex, etc) at least once a month. In the kitchen use an exhaust fan to remove steam from cooking.  Throw away spoiled foods immediately, and empty garbage daily.  Empty water pans below self-defrosting refrigerators frequently. Vent the clothes dryer to the outside. Limit indoor houseplants; mold grows in the dirt.  No houseplants in the bedroom. Remove carpet from the bedroom. Encase the mattress and box springs with a zippered encasing.  Molds - Outdoor avoidance Avoid being outside when the grass is being mowed, or the ground is tilled. Avoid playing in leaves, pine straw, hay, etc.  Dead plant materials contain mold. Avoid going into barns or grain storage areas. Remove leaves, clippings and compost from around the home.  Pollen Avoidance Pollen levels are highest during the mid-day and afternoon.  Consider this when planning outdoor activities. Avoid being outside when the grass is being mowed, or wear a mask if the pollen-allergic person must be the one to mow the grass. Keep the windows closed to keep pollen outside of the home. Use an air conditioner to filter the air. Take a shower, wash hair, and change clothing after working or playing outdoors  during pollen season.    Return in about 6 weeks (around 04/20/2024).  Alesia Morin, MD [facility]

## 2024-03-12 ENCOUNTER — Encounter: Payer: Self-pay | Admitting: Physician Assistant

## 2024-03-12 ENCOUNTER — Encounter: Payer: Self-pay | Admitting: Gastroenterology

## 2024-03-15 ENCOUNTER — Other Ambulatory Visit (INDEPENDENT_AMBULATORY_CARE_PROVIDER_SITE_OTHER)

## 2024-03-15 DIAGNOSIS — Z113 Encounter for screening for infections with a predominantly sexual mode of transmission: Secondary | ICD-10-CM | POA: Diagnosis not present

## 2024-03-16 LAB — RPR: RPR Ser Ql: NONREACTIVE

## 2024-03-16 LAB — HIV ANTIBODY (ROUTINE TESTING W REFLEX): HIV 1&2 Ab, 4th Generation: NONREACTIVE

## 2024-03-17 LAB — GC/CHLAMYDIA PROBE AMP
Chlamydia trachomatis, NAA: NEGATIVE
Neisseria Gonorrhoeae by PCR: NEGATIVE

## 2024-03-18 ENCOUNTER — Encounter: Payer: Self-pay | Admitting: Family

## 2024-03-22 NOTE — Progress Notes (Unsigned)
 Marland Kitchen

## 2024-03-23 ENCOUNTER — Telehealth (INDEPENDENT_AMBULATORY_CARE_PROVIDER_SITE_OTHER): Payer: 59 | Admitting: Adult Health

## 2024-03-23 ENCOUNTER — Telehealth: Payer: Self-pay

## 2024-03-23 DIAGNOSIS — G4733 Obstructive sleep apnea (adult) (pediatric): Secondary | ICD-10-CM | POA: Diagnosis not present

## 2024-03-23 NOTE — Progress Notes (Signed)
 PATIENT: [name] DOB: [birth year]  REASON FOR VISIT: follow up HISTORY FROM: patient PRIMARY NEUROLOGIST: Dr. Vickey Huger  Virtual Visit via Video Note  I connected with Cardell Peach on 03/23/24 at  2:30 PM EDT by a video enabled telemedicine application located remotely at [facility] and verified that I am speaking with the correct person using two identifiers who was located at their own home.   I discussed the limitations of evaluation and management by telemedicine and the availability of in person appointments. The patient expressed understanding and agreed to proceed.   PATIENT: [name] DOB: 1986-04-26  REASON FOR VISIT: follow up HISTORY FROM: patient  HISTORY OF PRESENT ILLNESS: Today 03/23/24:  Sang Blount is a 38 y.o. male with a history of OSA on CPAP. Returns today for follow-up.  He states that overall he is sleeping relatively well.  He has lost additional weight.  He is currently at 265 pounds.  His goal is 230.  He does have a smart watch and noticed that his sleep percentages for deep sleep is around 17 to 18%.  He notices that when he gets between 20 and 30% he feels more rested.  He does state that caffeine has the opposite effect for him.  Without caffeine he has insomnia.  With caffeine he is able to sleep.  His download is below    11/27/23: [name] is a 38 y.o. male with a history of OSA on CPAP. Returns today for follow-up. Reports that CPAP is working well.  Continues to notice the benefit.  He is on Zepbound and has lost approximately 25 pounds.  His goal weight is between 200-230 pounds.  His download is below      REVIEW OF SYSTEMS: Out of a complete 14 system review of symptoms, the patient complains only of the following symptoms, and all other reviewed systems are negative.  ALLERGIES: Allergies  Allergen Reactions   Metformin And Related Other (See Comments)    Felt dizzy   Vicks Vaporub  [Camph-Eucalypt-Men-Turp-Pet] Rash    HOME MEDICATIONS: Outpatient Medications Prior to Visit  Medication Sig Dispense Refill   azelastine (ASTELIN) 0.1 % nasal spray Place 2 sprays into both nostrils 2 (two) times daily as needed. Use in each nostril as directed 30 mL 5   cetirizine (ZYRTEC) 10 MG tablet Take 1 tablet (10 mg total) by mouth 2 (two) times daily. 60 tablet 5   CLOMID 50 MG tablet Take 50 mg by mouth daily.     fluticasone (FLONASE) 50 MCG/ACT nasal spray Place 2 sprays into both nostrils daily. 16 g 5   Multiple Vitamin (ONE-A-DAY MENS PO)      No facility-administered medications prior to visit.    PAST MEDICAL HISTORY: Past Medical History:  Diagnosis Date   Allergies    History of kidney stones    OSA on CPAP    Palpitations    Pericardial cyst    dx from Ct Abdomen 2013 - no follow up   Sleep apnea     PAST SURGICAL HISTORY: Past Surgical History:  Procedure Laterality Date   MULTIPLE TOOTH EXTRACTIONS     NASAL SEPTOPLASTY W/ TURBINOPLASTY Bilateral 01/29/2019   Procedure: NASAL SEPTOPLASTY WITH BILATERAL TURBINATE REDUCTION;  Surgeon: Drema Halon, MD;  Location: [facility] OR;  Service: ENT;  Laterality: Bilateral;   NASAL SINUS SURGERY Bilateral 01/29/2019   Procedure: ENDOSCOPIC SINUS SURGERY;  Surgeon: Drema Halon, MD;  Location: [facility] OR;  Service:  ENT;  Laterality: Bilateral;   POLYPECTOMY Right 01/29/2019   Procedure: RIGHT NASAL POLYPECTOMY;  Surgeon: Drema Halon, MD;  Location: [facility] OR;  Service: ENT;  Laterality: Right;   WISDOM TOOTH EXTRACTION      FAMILY HISTORY: Family History  Problem Relation Age of Onset   Hypertension Mother    Sleep apnea Mother    Obesity Mother    Allergic rhinitis Father    Asthma Father    Obesity Father    Sleep apnea Father    Hypertension Father    Hyperlipidemia Father    Alcoholism Father    Allergic rhinitis Sister    Asthma Sister    Stomach cancer Maternal Grandfather    Colon  cancer Paternal Grandfather     SOCIAL HISTORY: Social History   Socioeconomic History   Marital status: Divorced    Spouse name: [name]   Number of children: 0   Years of education: [employer] education level: Associate degree: academic program  Occupational History   Occupation: Art gallery manager.     Comment: [employer]   Occupation: Neurosurgeon  Tobacco Use   Smoking status: Never    Passive exposure: Past (Mother)   Smokeless tobacco: Never  Vaping Use   Vaping status: Never Used  Substance and Sexual Activity   Alcohol use: Yes    Comment: occ.   Drug use: No   Sexual activity: Yes    Partners: Female    Comment: with monogamous wife  Other Topics Concern   Not on file  Social History Narrative   Diet: Average American diet: breakfast is cereal or oatmeal, lunch is typical a lean cuisine, dinner is typical spaghetti, meat and sides, tacos, and hamburgers. Eats veggies. Does not eat a lot of fruit. Drinks mostly diet sodas and sweet tea. Trying to drink more water.       Exercise: He is exercising 2-3 days a week. Goes to the park and does brisk walking for about an hour. Trying to increase it to 5 days a week.       Patient is currently going though a divorce   Social Drivers of Corporate investment banker Strain: Low Risk  (01/23/2024)   Overall Financial Resource Strain (CARDIA)    Difficulty of Paying Living Expenses: Not hard at all  Food Insecurity: No Food Insecurity (01/23/2024)   Hunger Vital Sign    Worried About Running Out of Food in the Last Year: Never true    Ran Out of Food in the Last Year: Never true  Transportation Needs: No Transportation Needs (01/23/2024)   PRAPARE - Administrator, Civil Service (Medical): No    Lack of Transportation (Non-Medical): No  Physical Activity: Sufficiently Active (01/23/2024)   Exercise Vital Sign    Days of Exercise per Week: 3 days    Minutes of Exercise per  Session: 90 min  Stress: No Stress Concern Present (01/23/2024)   [employer] of Occupational Health - Occupational Stress Questionnaire    Feeling of Stress : Only a little  Social Connections: Socially Isolated (01/23/2024)   Social Connection and Isolation Panel [NHANES]    Frequency of Communication with Friends and Family: Never    Frequency of Social Gatherings with Friends and Family: More than three times a week    Attends Religious Services: Never    Database administrator or Organizations: No    Attends Banker Meetings:  Not on file    Marital Status: Divorced  Intimate Partner Violence: Unknown (05/05/2023)   [facility] from [facility]   HITS    Physically Hurt: Not on file    Insult or Talk Down To: Not on file    Threaten Physical Harm: Not on file    Scream or Curse: Not on file      PHYSICAL EXAM Generalized: Well developed, in no acute distress   Neurological examination  Mentation: Alert oriented to time, place, history taking. Follows all commands speech and language fluent Cranial nerve II-XII: Facial symmetry noted  DIAGNOSTIC DATA (LABS, IMAGING, TESTING) - I reviewed patient records, labs, notes, testing and imaging myself where available.  Lab Results  Component Value Date   WBC 7.4 02/06/2023   HGB 14.7 02/06/2023   HCT 43.8 02/06/2023   MCV 88 02/06/2023   PLT 282 02/06/2023      Component Value Date/Time   NA 140 10/20/2023 0835   K 4.3 10/20/2023 0835   CL 104 10/20/2023 0835   CO2 22 10/20/2023 0835   GLUCOSE 84 10/20/2023 0835   GLUCOSE 82 01/22/2019 1533   BUN 18 10/20/2023 0835   CREATININE 0.95 10/20/2023 0835   CALCIUM 9.5 10/20/2023 0835   PROT 6.9 10/20/2023 0835   ALBUMIN 4.2 10/20/2023 0835   AST 19 10/20/2023 0835   ALT 17 10/20/2023 0835   ALKPHOS 65 10/20/2023 0835   BILITOT 0.4 10/20/2023 0835   GFRNONAA >60 01/22/2019 1533   GFRAA >60 01/22/2019 1533   Lab Results  Component Value  Date   CHOL 156 02/06/2023   HDL 40 02/06/2023   LDLCALC 92 02/06/2023   TRIG 135 02/06/2023   CHOLHDL 4.1 11/08/2017   Lab Results  Component Value Date   HGBA1C 4.9 10/20/2023   Lab Results  Component Value Date   VITAMINB12 531 02/06/2023   Lab Results  Component Value Date   TSH 1.610 02/06/2023      ASSESSMENT AND PLAN 38 y.o. year old male  has a past medical history of Allergies, History of kidney stones, OSA on CPAP, Palpitations, Pericardial cyst, and Sleep apnea. here with:  OSA on CPAP  CPAP compliance excellent Residual AHI is good Encouraged patient to continue using CPAP nightly and > 4 hours each night Once he reaches goal weight we can consider repeating HST F/U in 1 year or sooner if needed    Butch [name], MSN, NP-C 03/23/2024, 2:24 PM [facility] [address], [address] [place], [place] [address] [phone]

## 2024-03-23 NOTE — Telephone Encounter (Signed)
 Please see MyChart message.

## 2024-04-06 ENCOUNTER — Telehealth: Payer: 59 | Admitting: Adult Health

## 2024-04-14 ENCOUNTER — Ambulatory Visit: Payer: Managed Care, Other (non HMO) | Admitting: Family Medicine

## 2024-04-14 ENCOUNTER — Encounter: Payer: Self-pay | Admitting: Family Medicine

## 2024-04-14 VITALS — BP 131/79 | HR 64 | Temp 98.3°F | Ht 67.0 in | Wt 260.0 lb

## 2024-04-14 DIAGNOSIS — E88819 Insulin resistance, unspecified: Secondary | ICD-10-CM | POA: Diagnosis not present

## 2024-04-14 DIAGNOSIS — E66813 Obesity, class 3: Secondary | ICD-10-CM

## 2024-04-14 DIAGNOSIS — Z6841 Body Mass Index (BMI) 40.0 and over, adult: Secondary | ICD-10-CM

## 2024-04-14 DIAGNOSIS — G4733 Obstructive sleep apnea (adult) (pediatric): Secondary | ICD-10-CM

## 2024-04-14 NOTE — Progress Notes (Signed)
 Office: [phone]  /  Fax: [phone]  WEIGHT SUMMARY AND BIOMETRICS  Starting Date: 02/06/23  Starting Weight: 291lb   Weight Lost Since Last Visit: 5lb   Vitals Temp: 98.3 F (36.8 C) BP: 131/79 Pulse Rate: 64 SpO2: 98 %   Body Composition  Body Fat %: 35.3 % Fat Mass (lbs): 91.8 lbs Muscle Mass (lbs): 160.2 lbs Total Body Water (lbs): 127 lbs Visceral Fat Rating : 19     HPI  Chief Complaint: OBESITY  [name] is here to discuss his progress with his obesity treatment plan. He is on the the Category 4 Plan and states he is following his eating plan approximately 60 % of the time. He states he is exercising weight training 90 minutes 2 times per week.   Interval History:  Since last office visit he is is down 5 lb This gives him a net weight loss of 31 lb in 14 mos He is eating out more due to a kitchen reno Doing well with breakfast and lunch and eating out for dinner He did have a trip to [place]  He is wearing his CPAP He is going to the gym for weights 2 x a week and doing some weights at home He is hiking and kayaking on the weekends He has had SE from metformin , Zepbound , Qsymia  He is practicing mindful eating with emotional eating  Pharmacotherapy: none  PHYSICAL EXAM:  Blood pressure 131/79, pulse 64, temperature 98.3 F (36.8 C), height 5\' 7"  (1.702 m), weight 260 lb (117.9 kg), SpO2 98%. Body mass index is 40.72 kg/m.  General: He is overweight, cooperative, alert, well developed, and in no acute distress. PSYCH: Has normal mood, affect and thought process.   Lungs: Normal breathing effort, no conversational dyspnea.   ASSESSMENT AND PLAN  TREATMENT PLAN FOR OBESITY:  Recommended Dietary Goals  Aviyon is currently in the action stage of change. As such, his goal is to continue weight management plan. He has agreed to keeping a food journal and adhering to recommended goals of 1800 calories and 120 g of protein and practicing  portion control and making smarter food choices, such as increasing vegetables and decreasing simple carbohydrates.  Behavioral Intervention  We discussed the following Behavioral Modification Strategies today: increasing lean protein intake to established goals, increasing fiber rich foods, increasing water intake , work on meal planning and preparation, work on Counselling psychologist calories using tracking application, keeping healthy foods at home, work on managing stress, creating time for self-care and relaxation, avoiding temptations and identifying enticing environmental cues, and continue to work on maintaining a reduced calorie state, getting the recommended amount of protein, incorporating whole foods, making healthy choices, staying well hydrated and practicing mindfulness when eating..  Additional resources provided today: NA  Recommended Physical Activity Goals  Mackinley has been advised to work up to 150 minutes of moderate intensity aerobic activity a week and strengthening exercises 2-3 times per week for cardiovascular health, weight loss maintenance and preservation of muscle mass.   He has agreed to Increase the intensity, frequency or duration of strengthening exercises  and Increase the intensity, frequency or duration of aerobic exercises    Pharmacotherapy changes for the treatment of obesity: none  ASSOCIATED CONDITIONS ADDRESSED TODAY  Insulin  resistance Fasting insulin  improved from 67.3--> 22.8, still with IR Has some signs of IR including carb/ sugar cravings but is doing better with limiting intake, eating on a schedule and adding lean protein and fiber to meals.  Intolerant  to metformin .  Insurance coverage issues with GLP-1 RA's  Continue to work on healthy lifestyle changes Increase daily steps   Class 3 severe obesity due to excess calories with serious comorbidity and body mass index (BMI) of 40.0 to 44.9 in adult (HCC)  OSA on CPAP Compliant with CPAP  nightly, managed by Dr Albertina Hugger Working on improving sleep time Job stress higher  Continue active plan for weight reduction     He was informed of the importance of frequent follow up visits to maximize his success with intensive lifestyle modifications for his multiple health conditions.   ATTESTASTION STATEMENTS:  Reviewed by clinician on day of visit: allergies, medications, problem list, medical history, surgical history, family history, social history, and previous encounter notes pertinent to obesity diagnosis.      Micky Albee, D.O. DABFM, DABOM [facility] [address] [place], [place] [address] [phone]

## 2024-04-16 ENCOUNTER — Ambulatory Visit: Admitting: Gastroenterology

## 2024-04-21 ENCOUNTER — Other Ambulatory Visit: Payer: Self-pay

## 2024-04-21 ENCOUNTER — Encounter: Payer: Self-pay | Admitting: Internal Medicine

## 2024-04-21 ENCOUNTER — Ambulatory Visit: Admitting: Internal Medicine

## 2024-04-21 VITALS — BP 122/78 | HR 77 | Temp 98.5°F | Resp 16 | Ht 67.0 in | Wt 262.2 lb

## 2024-04-21 DIAGNOSIS — J302 Other seasonal allergic rhinitis: Secondary | ICD-10-CM

## 2024-04-21 DIAGNOSIS — Z8709 Personal history of other diseases of the respiratory system: Secondary | ICD-10-CM | POA: Diagnosis not present

## 2024-04-21 DIAGNOSIS — J3089 Other allergic rhinitis: Secondary | ICD-10-CM | POA: Diagnosis not present

## 2024-04-21 DIAGNOSIS — J328 Other chronic sinusitis: Secondary | ICD-10-CM

## 2024-04-21 MED ORDER — CETIRIZINE HCL 10 MG PO TABS: 10.0000 mg | ORAL_TABLET | Freq: Two times a day (BID) | ORAL | 5 refills | Status: DC | PRN

## 2024-04-21 MED ORDER — AZELASTINE HCL 0.1 % NA SOLN: 2.0000 | Freq: Two times a day (BID) | NASAL | 5 refills | Status: DC | PRN

## 2024-04-21 MED ORDER — FLUTICASONE PROPIONATE 50 MCG/ACT NA SUSP: 2.0000 | Freq: Every day | NASAL | 5 refills | Status: DC

## 2024-04-21 NOTE — Patient Instructions (Addendum)
 Allergic Rhinitis: Chronic Sinusitis, History of Nasal Polyps s/p surgery 2020  - SPT 02/2024: positive to trees, grasses, weeds, molds - Use nasal saline rinses before nose sprays such as with Neilmed Sinus Rinse.  Use distilled water.   - Use Flonase  2 sprays each nostril daily. Aim upward and outward. - Use Azelastine  2 sprays each nostril twice daily as needed for runny nose, drainage, sneezing, congestion. Aim upward and outward. - Use Zyrtec  10 mg daily.  Okay to take an extra dose if needed.  - Consider allergy  shots as long term control of your symptoms by teaching your immune system to be more tolerant of your allergy  triggers.

## 2024-04-21 NOTE — Progress Notes (Signed)
   FOLLOW UP Date of Service/Encounter:  04/21/24   Subjective:  [name] (DOB: [birth year]) is a 38 y.o. male who [facility] for follow up for allergic rhinitis and chronic sinusitis, hx of nasal polyps s/p surgery in 2020.   History obtained from: chart review and patient. Last visit was with me on 03/09/2024 and at the time was SPT positive to pollens and molds, discussed use of Flonase , Azelastine , Zyrtec .  Consider AIT.    Since last visit, reports doing better.  Has noted improvement in congestion and decreased mucous drainage.  Using Flonase  and Zyrtec  daily.  Did travel to [place]  recently and forgot his Flonase  and noted some bloody drainage when he would blow his nose; this has resolved since restarting Flonase .  Also notes the past few days, has felt warm, fatigue and increased congestion/drainage.  No fevers.   Past Medical History: Past Medical History:  Diagnosis Date   Allergies    History of kidney stones    OSA on CPAP    Palpitations    Pericardial cyst    dx from Ct Abdomen 2013 - no follow up   Sleep apnea     Objective:  BP 122/78 (BP Location: Right Arm, Patient Position: Sitting)   Pulse 77   Temp 98.5 F (36.9 C) (Temporal)   Resp 16   Ht 5\' 7"  (1.702 m)   Wt 262 lb 3.2 oz (118.9 kg)   SpO2 96%   BMI 41.07 kg/m  Body mass index is 41.07 kg/m. Physical Exam: GEN: alert, well developed HEENT: clear conjunctiva, nose with mild inferior turbinate hypertrophy, pink nasal mucosa, slight clear rhinorrhea, + cobblestoning HEART: regular rate and rhythm, no murmur LUNGS: clear to auscultation bilaterally, no coughing, unlabored respiration SKIN: no rashes or lesions  Assessment:   1. Seasonal and perennial allergic rhinitis   2. Other chronic sinusitis   3. History of nasal polyp     Plan/Recommendations:  Allergic Rhinitis: Chronic Sinusitis, History of Nasal Polyps s/p surgery 2020  - Improved,  continue medical management as discussed below. Recent onset of worsening maybe a viral URI- symptomatic care at home, no need for abx.  - SPT 02/2024: positive to trees, grasses, weeds, molds - Use nasal saline rinses before nose sprays such as with Neilmed Sinus Rinse.  Use distilled water.   - Use Flonase  2 sprays each nostril daily. Aim upward and outward. - Use Azelastine  2 sprays each nostril twice daily as needed for runny nose, drainage, sneezing, congestion. Aim upward and outward. - Use Zyrtec  10 mg daily.  Okay to take an extra dose if needed.  - Consider allergy  shots as long term control of your symptoms by teaching your immune system to be more tolerant of your allergy  triggers.    Return in about 6 months (around 10/21/2024).  Kristen Petri, MD [facility]

## 2024-05-03 ENCOUNTER — Encounter (HOSPITAL_BASED_OUTPATIENT_CLINIC_OR_DEPARTMENT_OTHER): Payer: Self-pay | Admitting: Cardiology

## 2024-05-03 ENCOUNTER — Ambulatory Visit (HOSPITAL_BASED_OUTPATIENT_CLINIC_OR_DEPARTMENT_OTHER): Payer: Managed Care, Other (non HMO) | Admitting: Cardiology

## 2024-05-03 VITALS — BP 120/84 | HR 66 | Ht 67.0 in | Wt 265.0 lb

## 2024-05-03 DIAGNOSIS — G4733 Obstructive sleep apnea (adult) (pediatric): Secondary | ICD-10-CM

## 2024-05-03 DIAGNOSIS — Z7189 Other specified counseling: Secondary | ICD-10-CM

## 2024-05-03 DIAGNOSIS — Q248 Other specified congenital malformations of heart: Secondary | ICD-10-CM | POA: Diagnosis not present

## 2024-05-03 NOTE — Progress Notes (Signed)
 Cardiology Office Note:  .   Date:  05/03/2024  ID:  [name], DOB [birth year], MRN [id number] PCP: Adra Alanis, FNP  [facility] Providers Cardiologist:  Sheryle Donning, MD {  History of Present Illness: .   [name] is a 38 y.o. male with obesity, OSA on CPAP, pericardial cyst seen as a new consult today at the request of Amelie Jury for epicardial/pericardial cyst.   Today: He was referred 02/19/24 by Adra Alanis for epicardial cyst. On review of charts/notes, this was discovered in 2013 incidentally on CT abdomen pelvis. Referred to cardiology for further evaluation. Report from CT notes, "Small 3.0 cm epicardial cyst noted "  I personally reviewed images. Small pericardial cyst at RA/RV junction. We discussed at length. He has no symptoms. Discussed repeat imaging.   He has no other cardiac issues. Mother has CAD with stents, hypertension. Father has high blood pressure.  Both are obese.  He is working to lose weight, started with healthy weight and wellness. Has used Zepbound  in the past (did well with this), but now off this. Doing an hour of cardio and then weight training several times/week.   Peak weight 320 lbs, down 60 lbs over three years.  I reviewed his lipids from 01/2023, LDL 92, TG 135, HDL 40.  ROS: Denies chest pain, shortness of breath at rest or with normal exertion. No PND, orthopnea, LE edema or unexpected weight gain. No syncope or palpitations. ROS otherwise negative except as noted.   Studies Reviewed: Aaron Aas    EKG:  EKG Interpretation Date/Time:  Monday May 03 2024 08:41:16 EDT Ventricular Rate:  59 PR Interval:  168 QRS Duration:  96 QT Interval:  380 QTC Calculation: 376 R Axis:   54  Text Interpretation: Sinus bradycardia When compared with ECG of 24-Jul-2006 03:47, No significant change was found Confirmed by Sheryle Donning [phone]) on 05/03/2024 8:57:22 AM    Physical Exam:   VS:  BP  120/84   Pulse 66   Ht 5\' 7"  (1.702 m)   Wt 265 lb (120.2 kg)   SpO2 98%   BMI 41.50 kg/m    Wt Readings from Last 3 Encounters:  05/03/24 265 lb (120.2 kg)  04/21/24 262 lb 3.2 oz (118.9 kg)  04/14/24 260 lb (117.9 kg)    GEN: Well nourished, well developed in no acute distress HEENT: Normal, moist mucous membranes NECK: No JVD CARDIAC: regular rhythm, normal S1 and S2, no rubs or gallops. No murmur. VASCULAR: Radial and DP pulses 2+ bilaterally. No carotid bruits RESPIRATORY:  Clear to auscultation without rales, wheezing or rhonchi  ABDOMEN: Soft, non-tender, non-distended MUSCULOSKELETAL:  Ambulates independently SKIN: Warm and dry, no edema NEUROLOGIC:  Alert and oriented x 3. No focal neuro deficits noted. PSYCHIATRIC:  Normal affect    ASSESSMENT AND PLAN: .    Congential epicardial cyst -discussed options for monitoring, focused on CT vs echo. To avoid radiation, will evaluate with echo -asymptomatic -if size stable, does not need further routine monitoring  Obesity -working hard to lose weight, congratulated on success thus far -starting BMI 45, down to 41 today  OSA on CPAP -continue, may be able to re-assess in the future with significant weight loss -we did discuss that OSA is an indication for GLP if he is interested in this in the future  CV risk counseling and prevention -recommend heart healthy/Mediterranean diet, with whole grains, fruits, vegetable, fish, lean meats, nuts, and olive oil. Limit salt. -recommend  moderate walking, 3-5 times/week for 30-50 minutes each session. Aim for at least 150 minutes.week. Goal should be pace of 3 miles/hours, or walking 1.5 miles in 30 minutes -recommend avoidance of tobacco products. Avoid excess alcohol. -ASCVD risk score: The ASCVD Risk score (Arnett DK, et al., 2019) failed to calculate for the following reasons:   The 2019 ASCVD risk score is only valid for ages 4 to 2    Dispo: if imaging stable, I would be  happy to see him back as needed  Signed, Sheryle Donning, MD   Sheryle Donning, MD, PhD, [facility]  [facility]    Heart & Vascular at [facility] [address], [address] [place], [place] [address] [phone]

## 2024-05-03 NOTE — Patient Instructions (Addendum)
 Medication Instructions:  Your physician recommends that you continue on your current medications as directed. Please refer to the Current Medication list given to you today.  Testing/Procedures: Your physician has requested that you have an echocardiogram. Echocardiography is a painless test that uses sound waves to create images of your heart. It provides your doctor with information about the size and shape of your heart and how well your heart's chambers and valves are working. This procedure takes approximately one hour. There are no restrictions for this procedure. Please do NOT wear cologne, perfume, aftershave, or lotions (deodorant is allowed). Please arrive 15 minutes prior to your appointment time.  Please note: We ask at that you not bring children with you during ultrasound (echo/ vascular) testing. Due to room size and safety concerns, children are not allowed in the ultrasound rooms during exams. Our front office staff cannot provide observation of children in our lobby area while testing is being conducted. An adult accompanying a patient to their appointment will only be allowed in the ultrasound room at the discretion of the ultrasound technician under special circumstances. We apologize for any inconvenience.   Follow-Up: Please follow up as needed with Dr. Veryl Gottron, Slater Duncan, NP or Neomi Banks, NP

## 2024-05-04 ENCOUNTER — Encounter: Payer: Self-pay | Admitting: Physician Assistant

## 2024-05-04 ENCOUNTER — Ambulatory Visit: Admitting: Physician Assistant

## 2024-05-04 ENCOUNTER — Other Ambulatory Visit (INDEPENDENT_AMBULATORY_CARE_PROVIDER_SITE_OTHER)

## 2024-05-04 VITALS — BP 122/78 | HR 62 | Ht 67.0 in | Wt 261.0 lb

## 2024-05-04 DIAGNOSIS — R7989 Other specified abnormal findings of blood chemistry: Secondary | ICD-10-CM

## 2024-05-04 DIAGNOSIS — E66813 Obesity, class 3: Secondary | ICD-10-CM

## 2024-05-04 DIAGNOSIS — G4733 Obstructive sleep apnea (adult) (pediatric): Secondary | ICD-10-CM

## 2024-05-04 DIAGNOSIS — I318 Other specified diseases of pericardium: Secondary | ICD-10-CM | POA: Diagnosis not present

## 2024-05-04 DIAGNOSIS — K625 Hemorrhage of anus and rectum: Secondary | ICD-10-CM

## 2024-05-04 DIAGNOSIS — Z6841 Body Mass Index (BMI) 40.0 and over, adult: Secondary | ICD-10-CM

## 2024-05-04 LAB — BASIC METABOLIC PANEL WITH GFR
BUN: 12 mg/dL (ref 6–23)
CO2: 28 meq/L (ref 19–32)
Calcium: 9.1 mg/dL (ref 8.4–10.5)
Chloride: 106 meq/L (ref 96–112)
Creatinine, Ser: 0.9 mg/dL (ref 0.40–1.50)
GFR: 109.55 mL/min (ref >60.00)
Glucose, Bld: 105 mg/dL — ABNORMAL HIGH (ref 70–99)
Potassium: 4 meq/L (ref 3.5–5.1)
Sodium: 142 meq/L (ref 135–145)

## 2024-05-04 LAB — CBC WITH DIFFERENTIAL/PLATELET
Basophils Absolute: 0 10*3/uL (ref 0.0–0.1)
Basophils Relative: 0.7 % (ref 0.0–3.0)
Eosinophils Absolute: 0.1 10*3/uL (ref 0.0–0.7)
Eosinophils Relative: 0.8 % (ref 0.0–5.0)
HCT: 44.4 % (ref 39.0–52.0)
Hemoglobin: 15.3 g/dL (ref 13.0–17.0)
Lymphocytes Relative: 22.9 % (ref 12.0–46.0)
Lymphs Abs: 1.6 10*3/uL (ref 0.7–4.0)
MCHC: 34.4 g/dL (ref 30.0–36.0)
MCV: 86.5 fl (ref 78.0–100.0)
Monocytes Absolute: 0.6 10*3/uL (ref 0.1–1.0)
Monocytes Relative: 8.2 % (ref 3.0–12.0)
Neutro Abs: 4.7 10*3/uL (ref 1.4–7.7)
Neutrophils Relative %: 67.4 % (ref 43.0–77.0)
Platelets: 235 10*3/uL (ref 150.0–400.0)
RBC: 5.14 Mil/uL (ref 4.22–5.81)
RDW: 13.5 % (ref 11.5–15.5)
WBC: 7 10*3/uL (ref 4.0–10.5)

## 2024-05-04 LAB — HEPATIC FUNCTION PANEL
ALT: 16 U/L (ref 0–53)
AST: 16 U/L (ref 0–37)
Albumin: 4.3 g/dL (ref 3.5–5.2)
Alkaline Phosphatase: 41 U/L (ref 39–117)
Bilirubin, Direct: 0.1 mg/dL (ref 0.0–0.3)
Total Bilirubin: 0.7 mg/dL (ref 0.2–1.2)
Total Protein: 7 g/dL (ref 6.0–8.3)

## 2024-05-04 MED ORDER — NA SULFATE-K SULFATE-MG SULF 17.5-3.13-1.6 GM/177ML PO SOLN: 1.0000 | Freq: Once | ORAL | 0 refills | Status: AC

## 2024-05-04 NOTE — Progress Notes (Signed)
 05/04/2024 [name] [id number] [birth year]  Referring provider: Adra Alanis,* Primary GI doctor: Dr. Karene Oto  ASSESSMENT AND PLAN:  Rectal bleeding, was intermittent 9 months ago, increasing fiber has helped the issue PGF with colon cancer, unknown age Declines rectal today, likely hemorrhoids, has not had any further bleeding With family history, intermittent rectal bleeding will schedule colonoscopy at [facility] We have discussed the risks of bleeding, infection, perforation, medication reactions, and remote risk of death associated with colonoscopy. All questions were answered and the patient acknowledges these risk and wishes to proceed.  Congenital epicardial cyst see on CT 2013 Getting echocardiogram June 11 to evaluate No chest pain, no SOB, should be able to continue with colonoscopy  Elevated LFTs Isolated ALT elevation was 55 years ago then 48 most recently unremarkable 10/20/2023 CT abdomen pelvis without contrast 2013 for flank pain unremarkable liver and gallbladder 2-3 x a month ETOH, some NSAIDS, no drug use Likely fatty liver, will get RUQ US  and hepatitis panel Consider hepatocellular work up if any worsening LFTs  OSA On CPAP  Morbid obesity  Body mass index is 40.88 kg/m.  -Patient has been advised to make an attempt to improve diet and exercise patterns to aid in weight loss. -Recommended diet heavy in fruits and veggies and low in animal meats, cheeses, and dairy products, appropriate calorie intake - was on zepbound  lost 20-25 lbs but currently off of it   Patient Care Team: Adra Alanis, FNP as PCP - General (Internal Medicine) Sheryle Donning, MD as PCP - Cardiology (Cardiology)  HISTORY OF PRESENT ILLNESS: 38 y.o. male with a past medical history listed below presents as a new patient for evaluation of rectal bleeding.   Discussed the use of AI scribe software for clinical note transcription with the patient, who  gave verbal consent to proceed.  History of Present Illness   [name] "Rodman Clam" is a 38 year old male who presents with rectal bleeding and weight management. He was referred by his primary care physician for evaluation of rectal bleeding.  Several months ago, he noticed occasional red blood in his stool or on toilet paper. This was infrequent, and he did not initially worry about it. Approximately nine months have passed since he last observed any bleeding. He has increased his dietary fiber intake, particularly while on Zepbound , which he believes resolved the issue. No current changes in bowel habits, diarrhea, or constipation. He has bowel movements most days without significant straining or prolonged time on the toilet. No abdominal pain, rectal pain, burning, itching, or discomfort. No heartburn, nausea, vomiting, and difficulty swallowing, except for occasional issues related to postnasal drip.  He is focused on managing his weight. He was previously on Zepbound , a weight loss medication, and lost about 20 to 25 pounds. However, he is no longer on this medication due to insurance issues and is attempting to manage his weight without medication. He also uses a CPAP machine for sleep apnea.  His paternal grandfather passed away from colon cancer, although the exact age at diagnosis is unknown. His father is reportedly undergoing regular colonoscopies, though he is not in close contact.  He has a history of elevated liver function tests, specifically isolated ALT elevation, but his most recent liver function tests in October were normal. He had a CT scan in 2013 for flank pain, which showed normal liver and gallbladder. He drinks alcohol occasionally, about every other weekend, and has been using ibuprofen  recently for headaches, which  he attributes to allergies.        He  reports that he has never smoked. He has never been exposed to tobacco smoke. He has never used smokeless tobacco. He  reports current alcohol use. He reports that he does not use drugs.  RELEVANT GI HISTORY, IMAGING AND LABS: Results   LABS ALT: 55 Liver function tests: normal (09/2023)  RADIOLOGY Abdominal CT: normal liver and gallbladder (2013)      CBC    Component Value Date/Time   WBC 7.4 02/06/2023 0856   WBC 8.6 01/22/2019 1533   RBC 4.99 02/06/2023 0856   RBC 4.89 01/22/2019 1533   HGB 14.7 02/06/2023 0856   HCT 43.8 02/06/2023 0856   PLT 282 02/06/2023 0856   MCV 88 02/06/2023 0856   MCH 29.5 02/06/2023 0856   MCH 29.0 01/22/2019 1533   MCHC 33.6 02/06/2023 0856   MCHC 33.7 01/22/2019 1533   RDW 12.8 02/06/2023 0856   LYMPHSABS 1.5 02/06/2023 0856   EOSABS 0.1 02/06/2023 0856   BASOSABS 0.1 02/06/2023 0856   No results for input(s): "HGB" in the last 8760 hours.  CMP     Component Value Date/Time   NA 140 10/20/2023 0835   K 4.3 10/20/2023 0835   CL 104 10/20/2023 0835   CO2 22 10/20/2023 0835   GLUCOSE 84 10/20/2023 0835   GLUCOSE 82 01/22/2019 1533   BUN 18 10/20/2023 0835   CREATININE 0.95 10/20/2023 0835   CALCIUM 9.5 10/20/2023 0835   PROT 6.9 10/20/2023 0835   ALBUMIN 4.2 10/20/2023 0835   AST 19 10/20/2023 0835   ALT 17 10/20/2023 0835   ALKPHOS 65 10/20/2023 0835   BILITOT 0.4 10/20/2023 0835   GFRNONAA >60 01/22/2019 1533   GFRAA >60 01/22/2019 1533      Latest Ref Rng & Units 10/20/2023    8:35 AM 06/05/2023    9:18 AM 02/06/2023    8:56 AM  Hepatic Function  Total Protein 6.0 - 8.5 g/dL 6.9  6.9  6.7   Albumin 4.1 - 5.1 g/dL 4.2  4.2  4.4   AST 0 - 40 IU/L 19  30  24    ALT 0 - 44 IU/L 17  47  55   Alk Phosphatase 44 - 121 IU/L 65  103  103   Total Bilirubin 0.0 - 1.2 mg/dL 0.4  0.4  0.5       Current Medications:   Current Outpatient Medications (Endocrine & Metabolic):    CLOMID 50 MG tablet, Take 50 mg by mouth daily.   Current Outpatient Medications (Respiratory):    azelastine  (ASTELIN ) 0.1 % nasal spray, Place 2 sprays into both  nostrils 2 (two) times daily as needed for allergies. Use in each nostril as directed   cetirizine  (ZYRTEC ) 10 MG tablet, Take 1 tablet (10 mg total) by mouth 2 (two) times daily as needed for allergies.   fluticasone  (FLONASE ) 50 MCG/ACT nasal spray, Place 2 sprays into both nostrils daily.    Current Outpatient Medications (Other):    Multiple Vitamin (ONE-A-DAY MENS PO),    Na Sulfate-K Sulfate-Mg Sulfate concentrate (SUPREP) 17.5-3.13-1.6 GM/177ML SOLN, Take 1 kit (354 mLs total) by mouth once for 1 dose.  Medical History:  Past Medical History:  Diagnosis Date   Allergies    History of kidney stones    OSA on CPAP    Palpitations    Pericardial cyst    dx from Ct Abdomen 2013 - no follow up  Sleep apnea    Allergies:  Allergies  Allergen Reactions   Metformin  And Related Other (See Comments)    Felt dizzy   Vicks Vaporub [Camph-Eucalypt-Men-Turp-Pet] Rash     Surgical History:  He  has a past surgical history that includes Wisdom tooth extraction; Multiple tooth extractions; Nasal septoplasty w/ turbinoplasty (Bilateral, 01/29/2019); Polypectomy (Right, 01/29/2019); and Nasal sinus surgery (Bilateral, 01/29/2019). Family History:  His family history includes Alcoholism in his father; Allergic rhinitis in his father and sister; Asthma in his father and sister; Colon cancer in his paternal grandfather; Hyperlipidemia in his father; Hypertension in his father and mother; Obesity in his father and mother; Sleep apnea in his father and mother; Stomach cancer in his maternal grandfather.  REVIEW OF SYSTEMS  : All other systems reviewed and negative except where noted in the History of Present Illness.  PHYSICAL EXAM: BP 122/78   Pulse 62   Ht 5\' 7"  (1.702 m)   Wt 261 lb (118.4 kg)   BMI 40.88 kg/m  Physical Exam   GENERAL APPEARANCE: Well nourished, in no apparent distress. HEENT: No cervical lymphadenopathy, unremarkable thyroid, sclerae anicteric, conjunctiva  pink. RESPIRATORY: Respiratory effort normal, breath sounds clear to auscultation bilaterally without rales, rhonchi, or wheezing. CARDIO: Regular rate and rhythm with no murmurs, rubs, or gallops, peripheral pulses intact. ABDOMEN: Soft, non-distended, active bowel sounds in all four quadrants, non-tender to palpation, no rebound, no mass appreciated. RECTAL: Declines. MUSCULOSKELETAL: Full range of motion, normal gait, without edema. SKIN: Dry, intact without rashes or lesions. No jaundice. NEURO: Alert, oriented, no focal deficits. PSYCH: Cooperative, normal mood and affect.      Edmonia Gottron, PA-C 9:51 AM

## 2024-05-04 NOTE — Patient Instructions (Signed)
 You have been scheduled for an abdominal ultrasound at [facility] (1st floor of hospital) on 05/07/24 at 7:30 am. Please arrive 30 minutes prior to your appointment for registration. Make certain not to have anything to eat or drink 6 hours prior to your appointment. Should you need to reschedule your appointment, please contact radiology at [phone]. This test typically takes about 30 minutes to perform.  You have been scheduled for a colonoscopy. Please follow written instructions given to you at your visit today.   If you use inhalers (even only as needed), please bring them with you on the day of your procedure.  DO NOT TAKE 7 DAYS PRIOR TO TEST- Trulicity (dulaglutide) Ozempic, Wegovy (semaglutide) Mounjaro (tirzepatide ) Bydureon Bcise (exanatide extended release)  DO NOT TAKE 1 DAY PRIOR TO YOUR TEST Rybelsus (semaglutide) Adlyxin (lixisenatide) Victoza (liraglutide) Byetta (exanatide) ___________________________________________________________________________   _______________________________________________________  If your blood pressure at your visit was 140/90 or greater, please contact your primary care physician to follow up on this.  _______________________________________________________  If you are age 3 or older, your body mass index should be between 23-30. Your Body mass index is 40.88 kg/m. If this is out of the aforementioned range listed, please consider follow up with your Primary Care Provider.  If you are age 29 or younger, your body mass index should be between 19-25. Your Body mass index is 40.88 kg/m. If this is out of the aformentioned range listed, please consider follow up with your Primary Care Provider.   ________________________________________________________  The [facility] providers would like to encourage you to use MYCHART to communicate with providers for non-urgent requests or questions.  Due to long hold times on the  telephone, sending your provider a message by [facility] may be a faster and more efficient way to get a response.  Please allow 48 business hours for a response.  Please remember that this is for non-urgent requests.  _______________________________________________________

## 2024-05-04 NOTE — Progress Notes (Addendum)
 Agree with the assessment and plan as outlined by Santina Cull, PA-C.   Patient was seen in the cardiology clinic on 05/03/2024.  No active cardiac issues.  TTE ordered, but patient otherwise asymptomatic.  As long as the TTE shows a stable size, looks like he will not need further monitoring.  Can we just send a message over to Dr. Veryl Gottron who had seen him on 5/12 to ensure no barrier to proceeding with endoscopic procedures.  Jerry Lindau, DO, [facility]  Sent message Dr. Dr. Veryl Gottron.   Edmonia Gottron, PA-C

## 2024-05-05 LAB — HEPATITIS A ANTIBODY, TOTAL: Hepatitis A AB,Total: NONREACTIVE

## 2024-05-05 LAB — HEPATITIS B SURFACE ANTIGEN: Hepatitis B Surface Ag: NONREACTIVE

## 2024-05-05 LAB — HEPATITIS C ANTIBODY: Hepatitis C Ab: NONREACTIVE

## 2024-05-06 ENCOUNTER — Ambulatory Visit: Payer: Self-pay | Admitting: Physician Assistant

## 2024-05-07 ENCOUNTER — Ambulatory Visit (HOSPITAL_COMMUNITY)
Admission: RE | Admit: 2024-05-07 | Discharge: 2024-05-07 | Disposition: A | Discharge status: Home / Self Care | Source: Ambulatory Visit | Attending: Physician Assistant | Admitting: Physician Assistant

## 2024-05-07 DIAGNOSIS — R7989 Other specified abnormal findings of blood chemistry: Secondary | ICD-10-CM | POA: Insufficient documentation

## 2024-05-12 ENCOUNTER — Ambulatory Visit: Admitting: Family Medicine

## 2024-05-12 ENCOUNTER — Encounter: Payer: Self-pay | Admitting: Family Medicine

## 2024-05-12 VITALS — BP 130/84 | HR 54 | Temp 98.6°F | Ht 67.0 in | Wt 254.0 lb

## 2024-05-12 DIAGNOSIS — E66812 Obesity, class 2: Secondary | ICD-10-CM | POA: Diagnosis not present

## 2024-05-12 DIAGNOSIS — Z6839 Body mass index (BMI) 39.0-39.9, adult: Secondary | ICD-10-CM | POA: Diagnosis not present

## 2024-05-12 DIAGNOSIS — G4733 Obstructive sleep apnea (adult) (pediatric): Secondary | ICD-10-CM

## 2024-05-12 DIAGNOSIS — E6609 Other obesity due to excess calories: Secondary | ICD-10-CM

## 2024-05-12 DIAGNOSIS — E88819 Insulin resistance, unspecified: Secondary | ICD-10-CM | POA: Diagnosis not present

## 2024-05-12 NOTE — Progress Notes (Signed)
 Office: [phone]  /  Fax: [phone]  WEIGHT SUMMARY AND BIOMETRICS  Starting Date: 02/06/23  Starting Weight: 291lb   Weight Lost Since Last Visit: 6lb   Vitals Temp: 98.6 F (37 C) BP: 130/84 Pulse Rate: (!) 54 SpO2: 97 %   Body Composition  Body Fat %: 32.3 % Fat Mass (lbs): 82.2 lbs Muscle Mass (lbs): 164 lbs Total Body Water (lbs): 122.8 lbs Visceral Fat Rating : 17    HPI  Chief Complaint: OBESITY  [name] is here to discuss his progress with his obesity treatment plan. He is on the the Category 4 Plan and states he is following his eating plan approximately 80 % of the time. He states he is exercising 60+ minutes 2 times per week.  Interval History:  Since last office visit he is down 6 lb He met with GI for rectal bleeding and is scheduled for a colonoscopy This gives him a net weight loss of 37 lb in the past 15 mos This is a 12% TBW loss without use of AOM He has been more tired with some job stress He is wearing his CPAP consistently at night  He has not been tracking his intake  He is eating a low cal Factor meal for lunch and keeps dinner lower carb with a Financial controller for breakfast  Pharmacotherapy: none  PHYSICAL EXAM:  Blood pressure 130/84, pulse (!) 54, temperature 98.6 F (37 C), height 5\' 7"  (1.702 m), weight 254 lb (115.2 kg), SpO2 97%. Body mass index is 39.78 kg/m.  General: He is overweight, cooperative, alert, well developed, and in no acute distress. PSYCH: Has normal mood, affect and thought process.   Lungs: Normal breathing effort, no conversational dyspnea.  ASSESSMENT AND PLAN  TREATMENT PLAN FOR OBESITY:  Recommended Dietary Goals  [name] is currently in the action stage of change. As such, his goal is to continue weight management plan. He has agreed to practicing portion control and making smarter food choices, such as increasing vegetables and decreasing simple carbohydrates.  Behavioral  Intervention  We discussed the following Behavioral Modification Strategies today: increasing lean protein intake to established goals, increasing fiber rich foods, increasing water intake , work on meal planning and preparation, keeping healthy foods at home, decreasing eating out or consumption of processed foods, and making healthy choices when eating convenient foods, practice mindfulness eating and understand the difference between hunger signals and cravings, work on managing stress, creating time for self-care and relaxation, avoiding temptations and identifying enticing environmental cues, continue to practice mindfulness when eating, and continue to work on maintaining a reduced calorie state, getting the recommended amount of protein, incorporating whole foods, making healthy choices, staying well hydrated and practicing mindfulness when eating..  Additional resources provided today: NA  Recommended Physical Activity Goals  [name] has been advised to work up to 150 minutes of moderate intensity aerobic activity a week and strengthening exercises 2-3 times per week for cardiovascular health, weight loss maintenance and preservation of muscle mass.   He has agreed to Increase the intensity, frequency or duration of strengthening exercises  and Increase the intensity, frequency or duration of aerobic exercises    Pharmacotherapy changes for the treatment of obesity: none  ASSOCIATED CONDITIONS ADDRESSED TODAY  OSA on CPAP Compliant with CPAP use, managed by [name] Stress levels high with work and c/o some fatigue Continue active plan for weight reduction and work on improving sleep quality  Class 2 obesity due to excess calories with  body mass index (BMI) of 39.0 to 39.9 in adult, unspecified whether serious comorbidity present Reviewed body composition changes over 15 mos with bioimpedence He feels good about his progress down 12% TBW in 15 mog He has had SE from Zepbound ,  Qsymia  and metformin  and is doing well without the assistance of medication Encouraged more cooking at home, meal planning and fiber intake  Doing well with resistance training consistently 2 x days/wk  Has room to ramp up cardio to 1 hr 2-3 days/ wk  Insulin  resistance Last fasting insulin  10/28 22.8, improved from 67.3 Continue to work on reducing intake of added sugar and starches Great job with weight training Ramp up walking time     He was informed of the importance of frequent follow up visits to maximize his success with intensive lifestyle modifications for his multiple health conditions.   ATTESTASTION STATEMENTS:  Reviewed by clinician on day of visit: allergies, medications, problem list, medical history, surgical history, family history, social history, and previous encounter notes pertinent to obesity diagnosis.   I have personally spent 24 minutes total time today in preparation, patient care, nutritional counseling and education,  and documentation for this visit, including the following: review of most recent clinical lab tests, prescribing medications/ refilling medications, reviewing medical assistant documentation, review and interpretation of bioimpedence results.     Micky Albee, D.O. DABFM, DABOM [facility] [address] [place], [place] [address] [phone]

## 2024-05-23 ENCOUNTER — Encounter: Payer: Self-pay | Admitting: Family

## 2024-05-24 ENCOUNTER — Other Ambulatory Visit: Payer: Self-pay | Admitting: Family

## 2024-05-24 DIAGNOSIS — L819 Disorder of pigmentation, unspecified: Secondary | ICD-10-CM

## 2024-05-26 ENCOUNTER — Encounter: Payer: Self-pay | Admitting: Gastroenterology

## 2024-06-02 ENCOUNTER — Other Ambulatory Visit (HOSPITAL_BASED_OUTPATIENT_CLINIC_OR_DEPARTMENT_OTHER)

## 2024-06-03 ENCOUNTER — Encounter: Payer: Self-pay | Admitting: Gastroenterology

## 2024-06-03 ENCOUNTER — Ambulatory Visit: Admitting: Gastroenterology

## 2024-06-03 VITALS — BP 133/86 | HR 75 | Temp 99.0°F | Resp 13 | Ht 67.0 in | Wt 261.0 lb

## 2024-06-03 DIAGNOSIS — K641 Second degree hemorrhoids: Secondary | ICD-10-CM

## 2024-06-03 DIAGNOSIS — K625 Hemorrhage of anus and rectum: Secondary | ICD-10-CM | POA: Diagnosis not present

## 2024-06-03 DIAGNOSIS — K648 Other hemorrhoids: Secondary | ICD-10-CM | POA: Diagnosis not present

## 2024-06-03 DIAGNOSIS — K635 Polyp of colon: Secondary | ICD-10-CM | POA: Diagnosis present

## 2024-06-03 DIAGNOSIS — Z800 Family history of malignant neoplasm of digestive organs: Secondary | ICD-10-CM

## 2024-06-03 DIAGNOSIS — K6389 Other specified diseases of intestine: Secondary | ICD-10-CM | POA: Diagnosis not present

## 2024-06-03 DIAGNOSIS — D120 Benign neoplasm of cecum: Secondary | ICD-10-CM

## 2024-06-03 MED ORDER — SODIUM CHLORIDE 0.9 % IV SOLN: 500.0000 mL | Freq: Once | INTRAVENOUS | Status: DC

## 2024-06-03 NOTE — Progress Notes (Signed)
 Pt's states no medical or surgical changes since previsit or office visit.  Pt reports stool is still brown but liquid, States he followed all the instructions. CRNA & Dr. Karene Oto made aware.

## 2024-06-03 NOTE — Progress Notes (Signed)
 GASTROENTEROLOGY PROCEDURE H&P NOTE   Primary Care Physician: Adra Alanis, FNP    Reason for Procedure:  Hematochezia, family history of colon cancer  Plan:    Colonoscopy  Patient is appropriate for endoscopic procedure(s) in the ambulatory ([facility]) setting.  The nature of the procedure, as well as the risks, benefits, and alternatives were carefully and thoroughly reviewed with the patient. Ample time for discussion and questions allowed. The patient understood, was satisfied, and agreed to proceed.     HPI: [name] is a 38 y.o. male who presents for colonoscopy for evaluation of intermittent hematochezia.  Family history notable for paternal grandfather with colon cancer (age unknown).  Patient was most recently seen in the Gastroenterology Clinic on 05/04/2024.  Was seen in the Cardiology Clinic and completed TTE on 05/03/2024.  Per last no interval change in medical history since that appointment. Please refer to that note for full details regarding GI history and clinical presentation.   Past Medical History:  Diagnosis Date   Allergies    History of kidney stones    OSA on CPAP    Palpitations    Pericardial cyst    dx from Ct Abdomen 2013 - no follow up   Sleep apnea     Past Surgical History:  Procedure Laterality Date   MULTIPLE TOOTH EXTRACTIONS     NASAL SEPTOPLASTY W/ TURBINOPLASTY Bilateral 01/29/2019   Procedure: NASAL SEPTOPLASTY WITH BILATERAL TURBINATE REDUCTION;  Surgeon: Prescott Brodie, MD;  Location: [facility] OR;  Service: ENT;  Laterality: Bilateral;   NASAL SINUS SURGERY Bilateral 01/29/2019   Procedure: ENDOSCOPIC SINUS SURGERY;  Surgeon: Prescott Brodie, MD;  Location: [facility] OR;  Service: ENT;  Laterality: Bilateral;   POLYPECTOMY Right 01/29/2019   Procedure: RIGHT NASAL POLYPECTOMY;  Surgeon: Prescott Brodie, MD;  Location: [facility] OR;  Service: ENT;  Laterality: Right;   WISDOM TOOTH EXTRACTION      Prior to Admission medications    Medication Sig Start Date End Date Taking? Authorizing Provider  azelastine  (ASTELIN ) 0.1 % nasal spray Place 2 sprays into both nostrils 2 (two) times daily as needed for allergies. Use in each nostril as directed 04/21/24  Yes Patel, Ammie Bale, MD  cetirizine  (ZYRTEC ) 10 MG tablet Take 1 tablet (10 mg total) by mouth 2 (two) times daily as needed for allergies. 04/21/24  Yes Kandice Orleans, MD  CLOMID 50 MG tablet Take 50 mg by mouth daily. 09/03/23  Yes [provider]  fluticasone  (FLONASE ) 50 MCG/ACT nasal spray Place 2 sprays into both nostrils daily. 04/21/24  Yes Kandice Orleans, MD  Multiple Vitamin (ONE-A-DAY MENS PO)    Yes [provider]  Na Sulfate-K Sulfate-Mg Sulfate concentrate (SUPREP) 17.5-3.13-1.6 GM/177ML SOLN  05/04/24  Yes [provider]    Current Outpatient Medications  Medication Sig Dispense Refill   azelastine  (ASTELIN ) 0.1 % nasal spray Place 2 sprays into both nostrils 2 (two) times daily as needed for allergies. Use in each nostril as directed 30 mL 5   cetirizine  (ZYRTEC ) 10 MG tablet Take 1 tablet (10 mg total) by mouth 2 (two) times daily as needed for allergies. 60 tablet 5   CLOMID 50 MG tablet Take 50 mg by mouth daily.     fluticasone  (FLONASE ) 50 MCG/ACT nasal spray Place 2 sprays into both nostrils daily. 16 g 5   Multiple Vitamin (ONE-A-DAY MENS PO)      Na Sulfate-K Sulfate-Mg Sulfate concentrate (SUPREP) 17.5-3.13-1.6 GM/177ML SOLN  Current Facility-Administered Medications  Medication Dose Route Frequency Provider Last Rate Last Admin   0.9 %  sodium chloride  infusion  500 mL Intravenous Once Khai Torbert V, DO        Allergies as of 06/03/2024 - Review Complete 06/03/2024  Allergen Reaction Noted   Metformin  and related Other (See Comments) 04/29/2023   Vicks vaporub [camph-eucalypt-men-turp-pet] Rash 11/08/2017    Family History  Problem Relation Age of Onset   Hypertension Mother    Sleep apnea Mother     Obesity Mother    Allergic rhinitis Father    Asthma Father    Obesity Father    Sleep apnea Father    Hypertension Father    Hyperlipidemia Father    Alcoholism Father    Allergic rhinitis Sister    Asthma Sister    Stomach cancer Maternal Grandfather    Colon cancer Paternal Grandfather    Esophageal cancer Neg Hx     Social History   Socioeconomic History   Marital status: Divorced    Spouse name: Moira Andrews   Number of children: 0   Years of education: [employer] education level: Associate degree: academic program  Occupational History   Occupation: Art gallery manager.     Comment: [employer]   Occupation: Neurosurgeon  Tobacco Use   Smoking status: Never    Passive exposure: Never (Mother)   Smokeless tobacco: Never  Vaping Use   Vaping status: Never Used  Substance and Sexual Activity   Alcohol use: Yes    Comment: occ.   Drug use: No   Sexual activity: Yes    Partners: Female    Comment: with monogamous wife  Other Topics Concern   Not on file  Social History Narrative   Diet: Average American diet: breakfast is cereal or oatmeal, lunch is typical a lean cuisine, dinner is typical spaghetti, meat and sides, tacos, and hamburgers. Eats veggies. Does not eat a lot of fruit. Drinks mostly diet sodas and sweet tea. Trying to drink more water.       Exercise: He is exercising 2-3 days a week. Goes to the park and does brisk walking for about an hour. Trying to increase it to 5 days a week.       Patient is currently going though a divorce   Social Drivers of Corporate investment banker Strain: Low Risk  (01/23/2024)   Overall Financial Resource Strain (CARDIA)    Difficulty of Paying Living Expenses: Not hard at all  Food Insecurity: No Food Insecurity (01/23/2024)   Hunger Vital Sign    Worried About Running Out of Food in the Last Year: Never true    Ran Out of Food in the Last Year: Never true  Transportation Needs: No  Transportation Needs (01/23/2024)   PRAPARE - Administrator, Civil Service (Medical): No    Lack of Transportation (Non-Medical): No  Physical Activity: Sufficiently Active (01/23/2024)   Exercise Vital Sign    Days of Exercise per Week: 3 days    Minutes of Exercise per Session: 90 min  Stress: No Stress Concern Present (01/23/2024)   [employer] of Occupational Health - Occupational Stress Questionnaire    Feeling of Stress : Only a little  Social Connections: Socially Isolated (01/23/2024)   Social Connection and Isolation Panel    Frequency of Communication with Friends and Family: Never    Frequency of Social Gatherings with Friends and Family: More than  three times a week    Attends Religious Services: Never    Active Member of Clubs or Organizations: No    Attends Banker Meetings: Not on file    Marital Status: Divorced  Intimate Partner Violence: Unknown (05/05/2023)   Received from [facility]   HITS    Physically Hurt: Not on file    Insult or Talk Down To: Not on file    Threaten Physical Harm: Not on file    Scream or Curse: Not on file    Physical Exam: Vital signs in last 24 hours: @BP  128/75   Pulse 63   Temp 99 F (37.2 C) (Temporal)   Ht 5' 7 (1.702 m)   Wt 261 lb (118.4 kg)   SpO2 96%   BMI 40.88 kg/m  GEN: NAD EYE: Sclerae anicteric ENT: MMM CV: Non-tachycardic Pulm: CTA b/l GI: Soft, NT/ND NEURO:  Alert & Oriented x 3   Harry Lindau, DO [facility]   06/03/2024 10:08 AM

## 2024-06-03 NOTE — Progress Notes (Signed)
 Called to room to assist during endoscopic procedure.  Patient ID and intended procedure confirmed with present staff. Received instructions for my participation in the procedure from the performing physician.

## 2024-06-03 NOTE — Progress Notes (Signed)
 Report to PACU, RN, vss, BBS= Clear.

## 2024-06-03 NOTE — Patient Instructions (Addendum)
 Resume previous diet Continue present medications Await pathology results  Handouts/information given for polyps, high fiber diet and hemorrhoids  YOU HAD AN ENDOSCOPIC PROCEDURE TODAY AT THE [facility]:   Refer to the procedure report that was given to you for any specific questions about what was found during the examination.  If the procedure report does not answer your questions, please call your gastroenterologist to clarify.  If you requested that your care partner not be given the details of your procedure findings, then the procedure report has been included in a sealed envelope for you to review at your convenience later.  YOU SHOULD EXPECT: Some feelings of bloating in the abdomen. Passage of more gas than usual.  Walking can help get rid of the air that was put into your GI tract during the procedure and reduce the bloating. If you had a lower endoscopy (such as a colonoscopy or flexible sigmoidoscopy) you may notice spotting of blood in your stool or on the toilet paper. If you underwent a bowel prep for your procedure, you may not have a normal bowel movement for a few days.  Please Note:  You might notice some irritation and congestion in your nose or some drainage.  This is from the oxygen used during your procedure.  There is no need for concern and it should clear up in a day or so.  SYMPTOMS TO REPORT IMMEDIATELY: Following lower endoscopy (colonoscopy):  Excessive amounts of blood in the stool  Significant tenderness or worsening of abdominal pains  Swelling of the abdomen that is new, acute  Fever of 100F or higher  For urgent or emergent issues, a gastroenterologist can be reached at any hour by calling [phone]. Do not use MyChart messaging for urgent concerns.   DIET:  We do recommend a small meal at first, but then you may proceed to your regular diet.  Drink plenty of fluids but you should avoid alcoholic beverages for 24 hours.  ACTIVITY:  You  should plan to take it easy for the rest of today and you should NOT DRIVE or use heavy machinery until tomorrow (because of the sedation medicines used during the test).    FOLLOW UP: Our staff will call the number listed on your records the next business day following your procedure.  We will call around 7:15- 8:00 am to check on you and address any questions or concerns that you may have regarding the information given to you following your procedure. If we do not reach you, we will leave a message.     If any biopsies were taken you will be contacted by phone or by letter within the next 1-3 weeks.  Please call us  at [phone] if you have not heard about the biopsies in 3 weeks.    SIGNATURES/CONFIDENTIALITY: You and/or your care partner have signed paperwork which will be entered into your electronic medical record.  These signatures attest to the fact that that the information above on your After Visit Summary has been reviewed and is understood.  Full responsibility of the confidentiality of this discharge information lies with you and/or your care-partner.

## 2024-06-03 NOTE — Op Note (Signed)
 [facility] Patient Name: [name] Procedure Date: 06/03/2024 10:12 AM MRN: [id number] Endoscopist: Harry Lindau , MD, [id number] Age: 38 Referring MD:  Date of Birth: 28-Jun-1986 Gender: Male Account #: [id number] Procedure:                Colonoscopy Indications:              Hematochezia                           Family history notable for paternal grandfather                            with colon cancer (age unknown). Medicines:                Monitored Anesthesia Care Procedure:                Pre-Anesthesia Assessment:                           - Prior to the procedure, a History and Physical                            was performed, and patient medications and                            allergies were reviewed. The patient's tolerance of                            previous anesthesia was also reviewed. The risks                            and benefits of the procedure and the sedation                            options and risks were discussed with the patient.                            All questions were answered, and informed consent                            was obtained. Prior Anticoagulants: The patient has                            taken no anticoagulant or antiplatelet agents. ASA                            Grade Assessment: III - A patient with severe                            systemic disease. After reviewing the risks and                            benefits, the patient was deemed in satisfactory  condition to undergo the procedure.                           After obtaining informed consent, the colonoscope                            was passed under direct vision. Throughout the                            procedure, the patient's blood pressure, pulse, and                            oxygen saturations were monitored continuously. The                            CF [id number] #[id number] was introduced through the anus                             and advanced to the 10 cm into the ileum. The                            colonoscopy was performed without difficulty. The                            patient tolerated the procedure well. The quality                            of the bowel preparation was excellent. The                            terminal ileum, ileocecal valve, appendiceal                            orifice, and rectum were photographed. Scope In: 10:19:06 AM Scope Out: 10:34:22 AM Scope Withdrawal Time: 0 hours 12 minutes 6 seconds  Total Procedure Duration: 0 hours 15 minutes 16 seconds  Findings:                 The perianal and digital rectal examinations were                            normal.                           A 2 mm polyp was found in the cecum. The polyp was                            sessile. The polyp was removed with a cold snare.                            Resection and retrieval were complete. Estimated                            blood loss was minimal.  The remainder of the colon otherwise appeared                            normal.                           Non-bleeding internal hemorrhoids were found during                            retroflexion. The hemorrhoids were small.                           The terminal ileum appeared normal. Complications:            No immediate complications. Estimated Blood Loss:     Estimated blood loss was minimal. Impression:               - One 2 mm polyp in the cecum, removed with a cold                            snare. Resected and retrieved.                           - The entire examined colon is normal.                           - Non-bleeding internal hemorrhoids.                           - The examined portion of the ileum was normal. Recommendation:           - Patient has a contact number available for                            emergencies. The signs and symptoms of potential                             delayed complications were discussed with the                            patient. Return to normal activities tomorrow.                            Written discharge instructions were provided to the                            patient.                           - Resume previous diet.                           - Use fiber, for example Citrucel, Fibercon, Konsyl                            or Metamucil.                           -  Repeat colonoscopy for surveillance based on                            pathology results.                           - Return to GI office PRN. Harry Lindau, MD 06/03/2024 10:40:02 AM

## 2024-06-04 ENCOUNTER — Telehealth: Payer: Self-pay

## 2024-06-04 NOTE — Telephone Encounter (Signed)
 Attempted to reach patient for follow up phone call. No answer, left voicemail to contact Dr. Andre Kawasaki office with any questions or concerns.

## 2024-06-08 LAB — SURGICAL PATHOLOGY

## 2024-06-09 ENCOUNTER — Ambulatory Visit: Payer: Self-pay | Admitting: Gastroenterology

## 2024-06-10 ENCOUNTER — Other Ambulatory Visit (HOSPITAL_BASED_OUTPATIENT_CLINIC_OR_DEPARTMENT_OTHER)

## 2024-06-24 ENCOUNTER — Ambulatory Visit (HOSPITAL_BASED_OUTPATIENT_CLINIC_OR_DEPARTMENT_OTHER)

## 2024-06-24 DIAGNOSIS — I340 Nonrheumatic mitral (valve) insufficiency: Secondary | ICD-10-CM

## 2024-06-24 DIAGNOSIS — Q248 Other specified congenital malformations of heart: Secondary | ICD-10-CM

## 2024-06-24 LAB — ECHOCARDIOGRAM COMPLETE
Area-P 1/2: 3.31 cm2
S' Lateral: 3.12 cm

## 2024-07-08 ENCOUNTER — Ambulatory Visit (HOSPITAL_BASED_OUTPATIENT_CLINIC_OR_DEPARTMENT_OTHER): Payer: Self-pay | Admitting: Cardiology

## 2024-07-20 ENCOUNTER — Ambulatory Visit: Admitting: Family Medicine

## 2024-07-20 ENCOUNTER — Encounter: Payer: Self-pay | Admitting: Family Medicine

## 2024-07-20 VITALS — BP 134/80 | HR 58 | Temp 98.6°F | Ht 67.0 in | Wt 255.0 lb

## 2024-07-20 DIAGNOSIS — E65 Localized adiposity: Secondary | ICD-10-CM | POA: Diagnosis not present

## 2024-07-20 DIAGNOSIS — K760 Fatty (change of) liver, not elsewhere classified: Secondary | ICD-10-CM | POA: Diagnosis not present

## 2024-07-20 DIAGNOSIS — E88819 Insulin resistance, unspecified: Secondary | ICD-10-CM

## 2024-07-20 DIAGNOSIS — Z6839 Body mass index (BMI) 39.0-39.9, adult: Secondary | ICD-10-CM

## 2024-07-20 DIAGNOSIS — E66812 Obesity, class 2: Secondary | ICD-10-CM

## 2024-07-20 NOTE — Progress Notes (Signed)
 Office: [phone]  /  Fax: [phone]  WEIGHT SUMMARY AND BIOMETRICS  Starting Date: 02/06/23  Starting Weight: 291lb   Weight Lost Since Last Visit: 0lb   Vitals Temp: 98.6 F (37 C) BP: 134/80 Pulse Rate: (!) 58 SpO2: 97 %   Body Composition  Body Fat %: 34.4 % Fat Mass (lbs): 87.8 lbs Muscle Mass (lbs): 159.2 lbs Total Body Water (lbs): 125.2 lbs Visceral Fat Rating : 18     HPI  Chief Complaint: OBESITY  [name] is here to discuss his progress with his obesity treatment plan. He is on the the Category 4 Plan and states he is following his eating plan approximately 60 % of the time. He states he is exercising 45 minutes 3 times per week.  Interval History:  Since last office visit he is up 1 lb He just started with a personal trainer this month He is doing both cardio and weight training 3 days/wk Work stress has been high He has reigned in his poor food choices about 2 weeks ago, weighing in at home each morning He had some stress eating, better now He is still focused on protein, fruits and veggies Denies meal skipping This gives him a net weight loss of 36 pounds in the past 17 months of obesity management This is a 12.3% total body weight loss  Pharmacotherapy: None Previously had adverse side effects from Zepbound , Qsymia , metformin   PHYSICAL EXAM:  Blood pressure 134/80, pulse (!) 58, temperature 98.6 F (37 C), height 5' 7 (1.702 m), weight 255 lb (115.7 kg), SpO2 97%. Body mass index is 39.94 kg/m.  General: He is overweight, cooperative, alert, well developed, and in no acute distress. PSYCH: Has normal mood, affect and thought process.   Lungs: Normal breathing effort, no conversational dyspnea.   ASSESSMENT AND PLAN  TREATMENT PLAN FOR OBESITY:  Recommended Dietary Goals  [name] is currently in the action stage of change. As such, his goal is to continue weight management plan. He has agreed to keeping a food journal and  adhering to recommended goals of 2000 calories and 150 g of protein and practicing portion control and making smarter food choices, such as increasing vegetables and decreasing simple carbohydrates. Dietary change goals reviewed on after visit summary  Behavioral Intervention  We discussed the following Behavioral Modification Strategies today: increasing lean protein intake to established goals, increasing fiber rich foods, increasing water intake , work on meal planning and preparation, work on Counselling psychologist calories using tracking application, keeping healthy foods at home, work on managing stress, creating time for self-care and relaxation, avoiding temptations and identifying enticing environmental cues, continue to practice mindfulness when eating, planning for success, and continue to work on maintaining a reduced calorie state, getting the recommended amount of protein, incorporating whole foods, making healthy choices, staying well hydrated and practicing mindfulness when eating..  Additional resources provided today: NA  Recommended Physical Activity Goals  [name] has been advised to work up to 150 minutes of moderate intensity aerobic activity a week and strengthening exercises 2-3 times per week for cardiovascular health, weight loss maintenance and preservation of muscle mass.   He has agreed to Increase the intensity, frequency or duration of strengthening exercises  and Increase the intensity, frequency or duration of aerobic exercises   Great job working out with Systems analyst.  Aim for cardio and resistance training for total of at least 3 days a week  Pharmacotherapy changes for the treatment of obesity: None  ASSOCIATED CONDITIONS ADDRESSED TODAY  Insulin  resistance Last fasting insulin  high at 22.8, down from previous level 67.3.  He has had adverse side effects from metformin .  He is actively working on weight loss, reducing added sugar and starches.  He has  recently added weight training and some cardio at the gym 2 days a week.  Continue work on healthy dietary changes and plan to recheck fasting insulin  next visit.  Class 2 severe obesity due to excess calories with serious comorbidity and body mass index (BMI) of 39.0 to 39.9 in adult [facility]) Repeat fasting IC next visit Options are limited given his previous adverse side effects to different antiobesity medications.  NAFLD (nonalcoholic fatty liver disease) He has findings of hepatic steatosis on a right upper quadrant ultrasound done on 05/07/2024 done for elevated liver enzymes.  Liver enzymes have normalized on most recent CMP.  Continue active plan for weight reduction.  Visceral Adiposity Visceral fat rating remains high at 18.  Initially as high as 23. Continue to work on a reduced calorie high-protein low carbohydrate/low sugar diet, regular exercise.  He has notable metabolic sequelae of obesity including insulin  resistance and hepatic steatosis.     He was informed of the importance of frequent follow up visits to maximize his success with intensive lifestyle modifications for his multiple health conditions.   ATTESTASTION STATEMENTS:  Reviewed by clinician on day of visit: allergies, medications, problem list, medical history, surgical history, family history, social history, and previous encounter notes pertinent to obesity diagnosis.   I have personally spent 25 minutes total time today in preparation, patient care, nutritional counseling and education,  and documentation for this visit, including the following: review of most recent clinical lab tests, reviewing medical assistant documentation, review and interpretation of bioimpedence results.     Darice Haddock, D.O. DABFM, DABOM [facility] [address] [place], [place] [address] [phone]

## 2024-07-20 NOTE — Patient Instructions (Signed)
 Aim for 2000 cal/ day (Don't add exercise into the app) Daily protein goal should be ~150 g/ day  Remember your veggies >> fruits  Avoid high sugar foods and drinks  Limit meals out to 2 per day  Aim for 8 hrs of sleep at night  Work on stress reduction, mindful eating  Great job working with a Psychologist, educational!!

## 2024-08-18 ENCOUNTER — Ambulatory Visit: Admitting: Family Medicine

## 2024-09-01 ENCOUNTER — Encounter: Payer: Self-pay | Admitting: Gastroenterology

## 2024-09-02 ENCOUNTER — Encounter: Payer: Self-pay | Admitting: Family Medicine

## 2024-09-02 ENCOUNTER — Ambulatory Visit: Admitting: Family Medicine

## 2024-09-02 VITALS — BP 128/82 | HR 61 | Temp 98.0°F | Ht 67.0 in | Wt 247.0 lb

## 2024-09-02 DIAGNOSIS — Z6838 Body mass index (BMI) 38.0-38.9, adult: Secondary | ICD-10-CM | POA: Diagnosis not present

## 2024-09-02 DIAGNOSIS — E88819 Insulin resistance, unspecified: Secondary | ICD-10-CM

## 2024-09-02 DIAGNOSIS — K760 Fatty (change of) liver, not elsewhere classified: Secondary | ICD-10-CM | POA: Diagnosis not present

## 2024-09-02 DIAGNOSIS — E66812 Obesity, class 2: Secondary | ICD-10-CM | POA: Diagnosis not present

## 2024-09-02 DIAGNOSIS — R0602 Shortness of breath: Secondary | ICD-10-CM

## 2024-09-02 DIAGNOSIS — E6609 Other obesity due to excess calories: Secondary | ICD-10-CM

## 2024-09-02 NOTE — Progress Notes (Signed)
 Office: [phone]  /  Fax: [phone]  WEIGHT SUMMARY AND BIOMETRICS  Starting Date: 02/06/23  Starting Weight: 291lb   Weight Lost Since Last Visit: 8lb   Vitals Temp: 98 F (36.7 C) BP: 128/82 Pulse Rate: 61 SpO2: 97 %   Body Composition  Body Fat %: 32.1 % Fat Mass (lbs): 79.4 lbs Muscle Mass (lbs): 159.6 lbs Total Body Water (lbs): 121.2 lbs Visceral Fat Rating : 16     HPI  Chief Complaint: OBESITY  [name] is here to discuss his progress with his obesity treatment plan. He is on the the Category 4 Plan and states he is following his eating plan approximately 80-90 % of the time. He states he is exercising 45 minutes 3 times per week.   Interval History:  Since last office visit he is down 8 lb He is up 0.4 lb of muscle mass and is down 8.4 lb of body fat since last visit This gives him a net weight loss of 44 lb in 18 mos of medically supervised weight management This is a 15% TBW loss He is working out with a trainer 3 days/ wk- started 6 weeks ago - doing 30 min of resistance training and 15 min on the treadmill 3 days/ wk He does have a sedentary job  but is doing more hiking and kayaking on the weekends He is eating low carb poptarts and better choices for dinner Sugar cravings have improved Hunger and cravings are under good control  Pharmacotherapy: none  PHYSICAL EXAM:  Blood pressure 128/82, pulse 61, temperature 98 F (36.7 C), height 5' 7 (1.702 m), weight 247 lb (112 kg), SpO2 97%. Body mass index is 38.69 kg/m.  General: He is overweight, cooperative, alert, well developed, and in no acute distress. PSYCH: Has normal mood, affect and thought process.   Lungs: Normal breathing effort, no conversational dyspnea.  ASSESSMENT AND PLAN  TREATMENT PLAN FOR OBESITY:  Recommended Dietary Goals  [name] is currently in the action stage of change. As such, his goal is to continue weight management plan. He has agreed to keeping a  food journal and adhering to recommended goals of 2000-2200 calories and 110-130 g of  protein.  Behavioral Intervention  We discussed the following Behavioral Modification Strategies today: increasing lean protein intake to established goals, increasing fiber rich foods, increasing water intake , keeping healthy foods at home, and planning for success.  Additional resources provided today: NA  Recommended Physical Activity Goals  [name] has been advised to work up to 150 minutes of moderate intensity aerobic activity a week and strengthening exercises 2-3 times per week for cardiovascular health, weight loss maintenance and preservation of muscle mass.   He has agreed to Increase the intensity, frequency or duration of aerobic exercises   Great job working on [employer] w/ a trainer 3 days/ wk + walking 3 days/ wk Add in additional walking/ hiking 2 days/ wk (given sedentary job with lack of steps)  Pharmacotherapy changes for the treatment of obesity: none  ASSOCIATED CONDITIONS ADDRESSED TODAY  SOBOE (shortness of breath on exertion) Reviewed results of his fasting IC today Initial IC REE 2606 18 mos ago --> 2376 today This is a 230 kcal/ day reduction in basal metabolic rate.  Considering his 44 lb weight loss, this is acceptable He is working on sleep (getting > 8 hrs/ night), protein intake with meals and weight training 3 days/ wk to keep basal metabolic rate up Recommend adding in more  walking to offset hours of sitting at work on non - gym days  Class 2 obesity due to excess calories with body mass index (BMI) of 38.0 to 38.9 in adult, unspecified whether serious comorbidity present Improving Reviewed bioimpedence results Will continue w/o AOM  NAFLD (nonalcoholic fatty liver disease) Doing well with body fat reduction Last set of liver enzymes in Oct 2024 had normalized Aim for a visceral fat rating < 10  Insulin  resistance Last fasting insulin  10/20/23 high at  22.8 Doing well reducing sugar and starch intake, adding in both cardio and resistance training and has lost >15% TBW. Plan to repeat fasting insulin  next visit He had dizziness previously on metformin     He was informed of the importance of frequent follow up visits to maximize his success with intensive lifestyle modifications for his multiple health conditions.   ATTESTASTION STATEMENTS:  Reviewed by clinician on day of visit: allergies, medications, problem list, medical history, surgical history, family history, social history, and previous encounter notes pertinent to obesity diagnosis.   I have personally spent 30 minutes total time today in preparation, patient care, nutritional counseling and education,  and documentation for this visit, including the following: review of most recent clinical lab tests, prescribing medications/ refilling medications, reviewing medical assistant documentation, review and interpretation of bioimpedence results.     Darice Haddock, D.O. DABFM, DABOM [facility] [address] [place], [place] [address] [phone]

## 2024-10-14 ENCOUNTER — Other Ambulatory Visit: Payer: Self-pay | Admitting: Surgery

## 2024-10-14 DIAGNOSIS — K824 Cholesterolosis of gallbladder: Secondary | ICD-10-CM

## 2024-10-20 ENCOUNTER — Ambulatory Visit: Admitting: Internal Medicine

## 2024-10-20 ENCOUNTER — Other Ambulatory Visit: Payer: Self-pay

## 2024-10-20 VITALS — BP 132/94 | HR 52 | Temp 98.3°F | Resp 16 | Ht 68.0 in | Wt 251.3 lb

## 2024-10-20 DIAGNOSIS — J328 Other chronic sinusitis: Secondary | ICD-10-CM

## 2024-10-20 DIAGNOSIS — Z8709 Personal history of other diseases of the respiratory system: Secondary | ICD-10-CM | POA: Diagnosis not present

## 2024-10-20 DIAGNOSIS — J302 Other seasonal allergic rhinitis: Secondary | ICD-10-CM | POA: Diagnosis not present

## 2024-10-20 DIAGNOSIS — J3089 Other allergic rhinitis: Secondary | ICD-10-CM

## 2024-10-20 MED ORDER — CETIRIZINE HCL 10 MG PO TABS: 10.0000 mg | ORAL_TABLET | Freq: Two times a day (BID) | ORAL | 11 refills | Status: AC | PRN

## 2024-10-20 MED ORDER — AZELASTINE HCL 0.1 % NA SOLN: 2.0000 | Freq: Two times a day (BID) | NASAL | 11 refills | Status: AC | PRN

## 2024-10-20 MED ORDER — FLUTICASONE PROPIONATE 50 MCG/ACT NA SUSP: 2.0000 | Freq: Every day | NASAL | 11 refills | Status: AC

## 2024-10-20 NOTE — Patient Instructions (Addendum)
 Allergic Rhinitis: Chronic Sinusitis, History of Nasal Polyps s/p surgery 2020  - SPT 02/2024: positive to trees, grasses, weeds, molds - Use nasal saline rinses before nose sprays such as with Neilmed Sinus Rinse.  Use distilled water.   - Use Flonase  1-2 sprays each nostril daily. Aim upward and outward. - Use Azelastine  2 sprays each nostril twice daily as needed for runny nose, drainage, sneezing, congestion. Aim upward and outward. - Use Zyrtec  10 mg daily.  Okay to take an extra dose if needed.  - Consider allergy  shots as long term control of your symptoms by teaching your immune system to be more tolerant of your allergy  triggers. - Use saline gel or vaseline for nasal dryness.

## 2024-10-20 NOTE — Progress Notes (Signed)
   FOLLOW UP Date of Service/Encounter:  10/20/24   Subjective:  [name] (DOB: Aug 17, 1986) is a 38 y.o. male who [facility] for follow up for allergic rhinitis and chronic sinusitis, hx of nasal polyps s/p surgery in 2020   History obtained from: chart review and patient. Last visit was with me on 04/21/2024 and at the time, discussed medical management and consider AIT.  On Flonase , Azelastine , Zyrtec .  Reports doing better since last visit.  Not as much sinus congestion, runny nose, drainage.  Does note having some nasal dryness sometimes.  Using Flonase  and Zyrtec  daily, Azelastine  PRN.  No sinus infections.  Sense of smell is okay.    Past Medical History: Past Medical History:  Diagnosis Date   Allergies    History of kidney stones    OSA on CPAP    Palpitations    Pericardial cyst    dx from Ct Abdomen 2013 - no follow up   Sleep apnea     Objective:  BP (!) 132/94 (BP Location: Left Arm, Patient Position: Sitting, Cuff Size: Normal)   Pulse (!) 52   Temp 98.3 F (36.8 C) (Temporal)   Resp 16   Ht 5' 8 (1.727 m)   Wt 251 lb 4.8 oz (114 kg)   SpO2 97%   BMI 38.21 kg/m  Body mass index is 38.21 kg/m. Physical Exam: GEN: alert, well developed HEENT: clear conjunctiva, nose with mild inferior turbinate hypertrophy, pink nasal mucosa, slight clear rhinorrhea, + cobblestoning HEART: regular rate and rhythm, no murmur LUNGS: clear to auscultation bilaterally, no coughing, unlabored respiration SKIN: no rashes or lesions  Assessment:   1. Seasonal and perennial allergic rhinitis   2. Other chronic sinusitis   3. History of nasal polyp     Plan/Recommendations:  Allergic Rhinitis: Chronic Sinusitis, History of Nasal Polyps s/p surgery 2020  - SPT 02/2024: positive to trees, grasses, weeds, molds - Controlled continue medical management, consider AIT.   - Use nasal saline rinses before nose sprays such as with  Neilmed Sinus Rinse.  Use distilled water.   - Use Flonase  1-2 sprays each nostril daily. Aim upward and outward. - Use Azelastine  2 sprays each nostril twice daily as needed for runny nose, drainage, sneezing, congestion. Aim upward and outward. - Use Zyrtec  10 mg daily.  Okay to take an extra dose if needed.  - Consider allergy  shots as long term control of your symptoms by teaching your immune system to be more tolerant of your allergy  triggers. - Use saline gel or vaseline for nasal dryness.      Return in about 1 year (around 10/20/2025).  Arleta Blanch, MD [facility]

## 2024-10-29 ENCOUNTER — Ambulatory Visit
Admission: RE | Admit: 2024-10-29 | Discharge: 2024-10-29 | Disposition: A | Discharge status: Home / Self Care | Source: Ambulatory Visit | Attending: Surgery | Admitting: Surgery

## 2024-10-29 DIAGNOSIS — K824 Cholesterolosis of gallbladder: Secondary | ICD-10-CM

## 2024-11-01 ENCOUNTER — Encounter: Payer: Self-pay | Admitting: Family Medicine

## 2024-11-01 ENCOUNTER — Ambulatory Visit: Admitting: Family Medicine

## 2024-11-01 VITALS — BP 125/84 | HR 54 | Temp 98.5°F | Ht 67.0 in | Wt 246.0 lb

## 2024-11-01 DIAGNOSIS — G4733 Obstructive sleep apnea (adult) (pediatric): Secondary | ICD-10-CM

## 2024-11-01 DIAGNOSIS — K760 Fatty (change of) liver, not elsewhere classified: Secondary | ICD-10-CM | POA: Diagnosis not present

## 2024-11-01 DIAGNOSIS — E559 Vitamin D deficiency, unspecified: Secondary | ICD-10-CM

## 2024-11-01 DIAGNOSIS — E66812 Obesity, class 2: Secondary | ICD-10-CM

## 2024-11-01 DIAGNOSIS — E88819 Insulin resistance, unspecified: Secondary | ICD-10-CM | POA: Diagnosis not present

## 2024-11-01 DIAGNOSIS — Z6838 Body mass index (BMI) 38.0-38.9, adult: Secondary | ICD-10-CM

## 2024-11-01 MED ORDER — ZEPBOUND 2.5 MG/0.5ML ~~LOC~~ SOAJ: 2.5000 mg | SUBCUTANEOUS | 0 refills | Status: DC

## 2024-11-01 NOTE — Progress Notes (Signed)
 Office: [phone]  /  Fax: [phone]  WEIGHT SUMMARY AND BIOMETRICS  Starting Date: 02/06/23  Starting Weight: 291lb   Weight Lost Since Last Visit: 1lb   Vitals Temp: 98.5 F (36.9 C) BP: 125/84 Pulse Rate: (!) 54 SpO2: 99 %   Body Composition  Body Fat %: 31.9 % Fat Mass (lbs): 78.8 lbs Muscle Mass (lbs): 159.8 lbs Total Body Water (lbs): 121.6 lbs Visceral Fat Rating : 16     HPI  Chief Complaint: OBESITY  [name] is here to discuss his progress with his obesity treatment plan. He is on the the Category 4 Plan and states he is following his eating plan approximately 25 % of the time. He states he is exercising 60 minutes 2 times per week.  Interval History:  Since last office visit he is down 1 lb This gives him a net weight loss of 45 lb in 19 mos of medically supervised weight management This is a 15% TBW loss He did have a few weekend trips and got off track with food since last visit He is getting in most of his meals on plan while at home He is getting in some fruits and veggies At home, he struggles more with sweets He has not been working out as much lately  Pharmacotherapy: none Metformin  caused dizziness Qsymia  caused eye strain Zepbound  worked well (d/c'd for insurance reasons)  PHYSICAL EXAM:  Blood pressure 125/84, pulse (!) 54, temperature 98.5 F (36.9 C), height 5' 7 (1.702 m), weight 246 lb (111.6 kg), SpO2 99%. Body mass index is 38.53 kg/m.  General: He is overweight, cooperative, alert, well developed, and in no acute distress. PSYCH: Has normal mood, affect and thought process.   Lungs: Normal breathing effort, no conversational dyspnea.  ASSESSMENT AND PLAN  TREATMENT PLAN FOR OBESITY:  Recommended Dietary Goals  [name] is currently in the action stage of change. As such, his goal is to continue weight management plan. He has agreed to the Category 4 Plan and keeping a food journal and adhering to recommended goals  of 2000 calories and 120 g of  protein.  Behavioral Intervention  We discussed the following Behavioral Modification Strategies today: increasing lean protein intake to established goals, increasing fiber rich foods, increasing water intake , work on tracking and journaling calories using tracking application, keeping healthy foods at home, identifying sources and decreasing liquid calories, continue to work on implementation of reduced calorie nutritional plan, continue to practice mindfulness when eating, and planning for success.  Additional resources provided today: NA  Recommended Physical Activity Goals  [name] has been advised to work up to 150 minutes of moderate intensity aerobic activity a week and strengthening exercises 2-3 times per week for cardiovascular health, weight loss maintenance and preservation of muscle mass.   He has agreed to Increase physical activity in their day and reduce sedentary time (increase NEAT). and Increase the intensity, frequency or duration of strengthening exercises   Pharmacotherapy changes for the treatment of obesity: none  ASSOCIATED CONDITIONS ADDRESSED TODAY  NAFLD (nonalcoholic fatty liver disease) Actively working on weight reduction, down 15% total body weight in the past 19 months medically supervised weight management Denies excess alcohol intake Recalculate fib 4 score based on labs today -     Comprehensive metabolic panel with GFR -     CBC  Insulin  resistance Working on changes to diet and exercise.  He has room for improvement with resistance training and walking time.  Avoid high sugar  foods and drinks.  Previously had dizziness from metformin  use. -     Insulin , random -     Hemoglobin A1c -     Lipid panel  Vitamin D  deficiency He is currently on a men's multivitamin once daily.  Energy levels have improved.  Recheck vitamin D  level today -     VITAMIN D  25 Hydroxy (Vit-D Deficiency, Fractures)  OSA on CPAP [name] has a  history of severe OSA with an apnea hypopnea index of 83.8, documented from 2019.  Continue weight reduction.  He is getting over 9 hours of sleep nightly with CPAP.  Begin use of Zepbound  once weekly injection -     Zepbound ; Inject 2.5 mg into the skin once a week.  Dispense: 2 mL; Refill: 0  Obesity, Class II, BMI 35-39.9 -     Zepbound ; Inject 2.5 mg into the skin once a week.  Dispense: 2 mL; Refill: 0 Continue to work on tracking calorie intake, increasing exercise frequency.  He did well on Zepbound  and has seen a plateau in weight loss without it.  BMI 38.0-38.9,adult      He was informed of the importance of frequent follow up visits to maximize his success with intensive lifestyle modifications for his multiple health conditions.   ATTESTASTION STATEMENTS:  Reviewed by clinician on day of visit: allergies, medications, problem list, medical history, surgical history, family history, social history, and previous encounter notes pertinent to obesity diagnosis.   I have personally spent 30 minutes total time today in preparation, patient care, nutritional counseling and education,  and documentation for this visit, including the following: review of most recent clinical lab tests, prescribing medications/ refilling medications, reviewing medical assistant documentation, review and interpretation of bioimpedence results.     [name], D.O. DABFM, DABOM [facility] [address] [place], [place] [address] [phone]

## 2024-11-02 ENCOUNTER — Ambulatory Visit: Payer: Self-pay | Admitting: Family Medicine

## 2024-11-02 ENCOUNTER — Telehealth: Payer: Self-pay | Admitting: *Deleted

## 2024-11-02 LAB — COMPREHENSIVE METABOLIC PANEL WITH GFR
ALT: 13 IU/L (ref 0–44)
AST: 11 IU/L (ref 0–40)
Albumin: 4.3 g/dL (ref 4.1–5.1)
Alkaline Phosphatase: 55 IU/L (ref 47–123)
BUN/Creatinine Ratio: 16 (ref 9–20)
BUN: 16 mg/dL (ref 6–20)
Bilirubin Total: 0.6 mg/dL (ref 0.0–1.2)
CO2: 25 mmol/L (ref 20–29)
Calcium: 9.6 mg/dL (ref 8.7–10.2)
Chloride: 105 mmol/L (ref 96–106)
Creatinine, Ser: 1.02 mg/dL (ref 0.76–1.27)
Globulin, Total: 2.2 g/dL (ref 1.5–4.5)
Glucose: 94 mg/dL (ref 70–99)
Potassium: 4.3 mmol/L (ref 3.5–5.2)
Sodium: 143 mmol/L (ref 134–144)
Total Protein: 6.5 g/dL (ref 6.0–8.5)
eGFR: 97 mL/min/1.73 (ref >59)

## 2024-11-02 LAB — VITAMIN D 25 HYDROXY (VIT D DEFICIENCY, FRACTURES): Vit D, 25-Hydroxy: 70.8 ng/mL (ref 30.0–100.0)

## 2024-11-02 LAB — CBC
Hematocrit: 44.4 % (ref 37.5–51.0)
Hemoglobin: 14.9 g/dL (ref 13.0–17.7)
MCH: 29.6 pg (ref 26.6–33.0)
MCHC: 33.6 g/dL (ref 31.5–35.7)
MCV: 88 fL (ref 79–97)
Platelets: 232 x10E3/uL (ref 150–450)
RBC: 5.04 x10E6/uL (ref 4.14–5.80)
RDW: 12.8 % (ref 11.6–15.4)
WBC: 7.1 x10E3/uL (ref 3.4–10.8)

## 2024-11-02 LAB — HEMOGLOBIN A1C
Est. average glucose Bld gHb Est-mCnc: 91 mg/dL
Hgb A1c MFr Bld: 4.8 % (ref 4.8–5.6)

## 2024-11-02 LAB — INSULIN, RANDOM: INSULIN: 24.7 u[IU]/mL (ref 2.6–24.9)

## 2024-11-02 LAB — LIPID PANEL
Chol/HDL Ratio: 3.8 ratio (ref 0.0–5.0)
Cholesterol, Total: 159 mg/dL (ref 100–199)
HDL: 42 mg/dL (ref >39)
LDL Chol Calc (NIH): 97 mg/dL (ref 0–99)
Triglycerides: 111 mg/dL (ref 0–149)
VLDL Cholesterol Cal: 20 mg/dL (ref 5–40)

## 2024-11-02 NOTE — Telephone Encounter (Signed)
 Prior authorization done via cover my meds for patients Zepbound. Waiting on determination.

## 2024-11-02 NOTE — Telephone Encounter (Signed)
 Prior authorization approved for patients Zepbound .  Message from Plan CaseId:[id number];Status:Approved;Review Type:Prior Auth;Coverage Start Date:10/03/2024;Coverage End Date:11/02/2025;. Authorization Expiration Date: November 02, 2025.

## 2024-11-08 ENCOUNTER — Telehealth: Payer: Self-pay

## 2024-11-08 NOTE — Telephone Encounter (Signed)
 RUQ US  was ordered by [facility], patient completed on 10/29/24. Labs completed on 11/01/24. All results in epic for review.

## 2024-11-08 NOTE — Telephone Encounter (Signed)
-----   Message from Nurse Chase B sent at 05/07/2024 12:17 PM EDT ----- Regarding: 55-month labs.US  RUQ US , CBC, and LFTs due - need to order [facility]  Dx: Fatty liver, elevated LFTs

## 2024-11-22 ENCOUNTER — Telehealth: Payer: Self-pay | Admitting: Family Medicine

## 2024-11-22 DIAGNOSIS — G4733 Obstructive sleep apnea (adult) (pediatric): Secondary | ICD-10-CM

## 2024-11-22 DIAGNOSIS — E66812 Obesity, class 2: Secondary | ICD-10-CM

## 2024-11-22 MED ORDER — ZEPBOUND 2.5 MG/0.5ML ~~LOC~~ SOAJ: 2.5000 mg | SUBCUTANEOUS | 0 refills | Status: DC

## 2024-11-22 NOTE — Telephone Encounter (Signed)
 Patient stated he needs a refill of Zepbound  before his next appointment. Please send it to [facility] on file. Last appointment was on If you have any questions, please give him a call at [phone].

## 2024-11-22 NOTE — Telephone Encounter (Signed)
 Last appointment was on 11/01/24.

## 2024-11-30 ENCOUNTER — Telehealth: Admitting: Family Medicine

## 2024-12-02 ENCOUNTER — Telehealth: Admitting: Family Medicine

## 2024-12-02 VITALS — Ht 67.0 in | Wt 242.0 lb

## 2024-12-02 DIAGNOSIS — G4733 Obstructive sleep apnea (adult) (pediatric): Secondary | ICD-10-CM

## 2024-12-02 DIAGNOSIS — E88819 Insulin resistance, unspecified: Secondary | ICD-10-CM

## 2024-12-02 DIAGNOSIS — Z6837 Body mass index (BMI) 37.0-37.9, adult: Secondary | ICD-10-CM | POA: Diagnosis not present

## 2024-12-02 DIAGNOSIS — E66812 Obesity, class 2: Secondary | ICD-10-CM

## 2024-12-02 DIAGNOSIS — K760 Fatty (change of) liver, not elsewhere classified: Secondary | ICD-10-CM

## 2024-12-02 MED ORDER — ZEPBOUND 2.5 MG/0.5ML ~~LOC~~ SOAJ: 2.5000 mg | SUBCUTANEOUS | 1 refills | Status: DC

## 2024-12-02 NOTE — Progress Notes (Signed)
 Office: [phone]  /  Fax: [phone]  WEIGHT SUMMARY AND BIOMETRICS  No data recorded No data recorded  No data recorded  No data recorded No data recorded I connected with  [name] on 12/02/2024 by a video and audio enabled telemedicine application and verified that I am speaking with the correct person using two identifiers.  Patient Location: Home  Provider Location: Office/Clinic  Persons Participating in Visit: Patient.  I discussed the limitations of evaluation and management by telemedicine. The patient expressed understanding and agreed to proceed.   Vital Signs: Because this visit was a virtual/telehealth visit, some criteria may be missing or patient reported. Any vitals not documented were not able to be obtained and vitals that have been documented are patient reported.      HPI  Chief Complaint: OBESITY  [name] is here to discuss his progress with his obesity treatment plan. He is on the keeping a food journal and adhering to recommended goals of 2000 calories and 100 g of  protein and practicing portion control and making smarter food choices, such as increasing vegetables and decreasing simple carbohydrates and states he is following his eating plan approximately 80 % of the time. He states he is exercising 60 minutes 3 times per week.  Interval History:  Since last office visit he is down 4 lb He has a cold, working from home today This gives him a net weight loss of 49 lb in 20 mos of medically supervised weight management This is a 16.8% TBW loss He is back on Zepbound  2.5 mg weekly, completed his 5th injection Able to see improved appetite control w/o meal skipping or GI upset He was working out, 15 min of cardio, 45 min weight lifting 3 x a week before he got sick He  is working on improving hydration and has room for more fruits and veggies  Pharmacotherapy: Zepbound  2.5 mg weekly  PHYSICAL EXAM:  Height 5' 7 (1.702 m), weight 242 lb  (109.8 kg). Body mass index is 37.9 kg/m.  General: He is overweight, cooperative, alert, well developed, and in no acute distress. PSYCH: Has normal mood, affect and thought process.   Lungs: Normal breathing effort, no conversational dyspnea.   ASSESSMENT AND PLAN  TREATMENT PLAN FOR OBESITY:  Recommended Dietary Goals  Sandford is currently in the action stage of change. As such, his goal is to continue weight management plan. He has agreed to keeping a food journal and adhering to recommended goals of 2000 calories and 100 g of  protein.  Behavioral Intervention  We discussed the following Behavioral Modification Strategies today: increasing lean protein intake to established goals, increasing vegetables, increasing fiber rich foods, increasing water intake , work on meal planning and preparation, work on tracking and journaling calories using tracking application, and continue to practice mindfulness when eating.  Additional resources provided today: NA  Recommended Physical Activity Goals  Vada has been advised to work up to 150 minutes of moderate intensity aerobic activity a week and strengthening exercises 2-3 times per week for cardiovascular health, weight loss maintenance and preservation of muscle mass.   He has agreed to Increase the intensity, frequency or duration of aerobic exercises    Pharmacotherapy changes for the treatment of obesity: none  ASSOCIATED CONDITIONS ADDRESSED TODAY  OSA on CPAP Doing well on CPAP  Actively working on weight reduction, down 16.8 % TBW in 20 mos Aim for 7-8 hrs of sleep at night -     Zepbound ;  Inject 2.5 mg into the skin once a week.  Dispense: 2 mL; Refill: 1  Obesity, Class II, BMI 35-39.9 -     Zepbound ; Inject 2.5 mg into the skin once a week.  Dispense: 2 mL; Refill: 1 He has adequate satiety and is tolerating restart of the lowest dose of Zepbound  Will continue at 2.5 mg weekly.  Patient was counseled on the  importance of maintaining healthy lifestyle habits, including balanced nutrition, regular physical activity, and behavioral modifications, while taking antiobesity medication.  Patient verbalized understanding that medication is an adjunct to, not a replacement for, lifestyle changes and that the long-term success and weight maintenance depend on continued adherence to these strategies.  BMI 37.0-37.9, adult  NAFLD (nonalcoholic fatty liver disease)  Fibrosis 4 Score = .49 (Low risk)        Interpretation for patients with NAFLD          <1.30       -  F0-F1 (Low risk)          1.30-2.67 -  Indeterminate           >2.67      -  F3-F4 (High risk)     Validated for ages 58-65 Reviewed labs obtained at his last visit Continue to work on healthy lifestyle changes and weight reduction with a goal BMI <30 Fib 4 score low risk.  Will hold off on fibroscan.       Insulin  resistance Stable Reviewed lab from last visit.  Fasting insulin  elevated at 24, previously 22.   Recommend increasing walking time given sedentary job Keep weight training 3 days/ wk [employer] job reducing carb and sugar intake      He was informed of the importance of frequent follow up visits to maximize his success with intensive lifestyle modifications for his multiple health conditions.   ATTESTASTION STATEMENTS:  Reviewed by clinician on day of visit: allergies, medications, problem list, medical history, surgical history, family history, social history, and previous encounter notes pertinent to obesity diagnosis.   I have personally spent 30 minutes total time today in preparation, patient care, nutritional counseling and education,  and documentation for this visit, including the following: review of most recent clinical lab tests, prescribing medications/ refilling medications, reviewing medical assistant documentation, review and interpretation of bioimpedence results.     Darice Haddock, D.O. DABFM, DABOM [facility] [address] [place], [place] [address] [phone]

## 2024-12-07 ENCOUNTER — Telehealth: Payer: Managed Care, Other (non HMO) | Admitting: Adult Health

## 2024-12-08 ENCOUNTER — Telehealth: Admitting: Adult Health

## 2024-12-08 DIAGNOSIS — G4733 Obstructive sleep apnea (adult) (pediatric): Secondary | ICD-10-CM

## 2024-12-08 NOTE — Progress Notes (Signed)
 PATIENT: [name] DOB: [birth year]  REASON FOR VISIT: follow up HISTORY FROM: patient PRIMARY NEUROLOGIST: Dr. Chalice  Virtual Visit via Video Note  I connected with [name] on 12/08/2024 at  3:30 PM EST by a video enabled telemedicine application located remotely at [facility] and verified that I am speaking with the correct person using two identifiers who was located at their own home in [place].   I discussed the limitations of evaluation and management by telemedicine and the availability of in person appointments. The patient expressed understanding and agreed to proceed.   PATIENT: [name] DOB: April 10, 1986  REASON FOR VISIT: follow up HISTORY FROM: patient  HISTORY OF PRESENT ILLNESS: Today 12/08/2024:  [name] is a 38 y.o. male with a history of obstructive sleep apnea on CPAP. Returns today for follow-up.  He reports that CPAP continues to work well for him.  He does state that he has had about a 60 pound weight loss.  But his goal is to lose 30 to 40 pounds more by June.  Currently wears the DreamWear mask.  Download is below      03/23/24: [name] is a 38 y.o. male with a history of OSA on CPAP. Returns today for follow-up.  He states that overall he is sleeping relatively well.  He has lost additional weight.  He is currently at 265 pounds.  His goal is 230.  He does have a smart watch and noticed that his sleep percentages for deep sleep is around 17 to 18%.  He notices that when he gets between 20 and 30% he feels more rested.  He does state that caffeine has the opposite effect for him.  Without caffeine he has insomnia.  With caffeine he is able to sleep.  His download is below    11/27/23: [name] is a 38 y.o. male with a history of OSA on CPAP. Returns today for follow-up. Reports that CPAP is working well.  Continues to notice the benefit.  He is on Zepbound  and has lost approximately 25 pounds.  His goal  weight is between 200-230 pounds.  His download is below      REVIEW OF SYSTEMS: Out of a complete 14 system review of symptoms, the patient complains only of the following symptoms, and all other reviewed systems are negative.  ALLERGIES: Allergies  Allergen Reactions   Metformin  And Related Other (See Comments)    Felt dizzy   Vicks Vaporub [Camph-Eucalypt-Men-Turp-Pet] Rash    HOME MEDICATIONS: Outpatient Medications Prior to Visit  Medication Sig Dispense Refill   azelastine  (ASTELIN ) 0.1 % nasal spray Place 2 sprays into both nostrils 2 (two) times daily as needed for allergies. Use in each nostril as directed 30 mL 11   cetirizine  (ZYRTEC ) 10 MG tablet Take 1 tablet (10 mg total) by mouth 2 (two) times daily as needed for allergies. 60 tablet 11   CLOMID 50 MG tablet Take 50 mg by mouth daily.     fluticasone  (FLONASE ) 50 MCG/ACT nasal spray Place 2 sprays into both nostrils daily. 16 g 11   Multiple Vitamin (ONE-A-DAY MENS PO)      tirzepatide  (ZEPBOUND ) 2.5 MG/0.5ML Pen Inject 2.5 mg into the skin once a week. 2 mL 1   No facility-administered medications prior to visit.    PAST MEDICAL HISTORY: Past Medical History:  Diagnosis Date   Allergies    History of kidney stones    OSA on CPAP  Palpitations    Pericardial cyst    dx from Ct Abdomen 2013 - no follow up   Sleep apnea     PAST SURGICAL HISTORY: Past Surgical History:  Procedure Laterality Date   MULTIPLE TOOTH EXTRACTIONS     NASAL SEPTOPLASTY W/ TURBINOPLASTY Bilateral 01/29/2019   Procedure: NASAL SEPTOPLASTY WITH BILATERAL TURBINATE REDUCTION;  Surgeon: Ethyl Lonni BRAVO, MD;  Location: [facility] OR;  Service: ENT;  Laterality: Bilateral;   NASAL SINUS SURGERY Bilateral 01/29/2019   Procedure: ENDOSCOPIC SINUS SURGERY;  Surgeon: Ethyl Lonni BRAVO, MD;  Location: [facility] OR;  Service: ENT;  Laterality: Bilateral;   POLYPECTOMY Right 01/29/2019   Procedure: RIGHT NASAL POLYPECTOMY;  Surgeon: Ethyl Lonni BRAVO, MD;  Location: [facility] OR;  Service: ENT;  Laterality: Right;   WISDOM TOOTH EXTRACTION      FAMILY HISTORY: Family History  Problem Relation Age of Onset   Hypertension Mother    Sleep apnea Mother    Obesity Mother    Allergic rhinitis Father    Asthma Father    Obesity Father    Sleep apnea Father    Hypertension Father    Hyperlipidemia Father    Alcoholism Father    Allergic rhinitis Sister    Asthma Sister    Stomach cancer Maternal Grandfather    Colon cancer Paternal Grandfather    Esophageal cancer Neg Hx     SOCIAL HISTORY: Social History   Socioeconomic History   Marital status: Divorced    Spouse name: Rosaline   Number of children: 0   Years of education: [employer] education level: Associate degree: academic program  Occupational History   Occupation: Art Gallery Manager.     Comment: [employer]   Occupation: Neurosurgeon  Tobacco Use   Smoking status: Never    Passive exposure: Never (Mother)   Smokeless tobacco: Never  Vaping Use   Vaping status: Never Used  Substance and Sexual Activity   Alcohol use: Yes    Comment: occ.   Drug use: No   Sexual activity: Yes    Partners: Female    Comment: with monogamous wife  Other Topics Concern   Not on file  Social History Narrative   Diet: Average American diet: breakfast is cereal or oatmeal, lunch is typical a lean cuisine, dinner is typical spaghetti, meat and sides, tacos, and hamburgers. Eats veggies. Does not eat a lot of fruit. Drinks mostly diet sodas and sweet tea. Trying to drink more water.       Exercise: He is exercising 2-3 days a week. Goes to the park and does brisk walking for about an hour. Trying to increase it to 5 days a week.       Patient is currently going though a divorce   Social Drivers of Health   Tobacco Use: Low Risk (11/01/2024)   Patient History    Smoking Tobacco Use: Never    Smokeless Tobacco Use: Never    Passive  Exposure: Never  Financial Resource Strain: Low Risk (01/23/2024)   Overall Financial Resource Strain (CARDIA)    Difficulty of Paying Living Expenses: Not hard at all  Food Insecurity: No Food Insecurity (01/23/2024)   Hunger Vital Sign    Worried About Running Out of Food in the Last Year: Never true    Ran Out of Food in the Last Year: Never true  Transportation Needs: No Transportation Needs (01/23/2024)   PRAPARE - Transportation    Lack of  Transportation (Medical): No    Lack of Transportation (Non-Medical): No  Physical Activity: Sufficiently Active (01/23/2024)   Exercise Vital Sign    Days of Exercise per Week: 3 days    Minutes of Exercise per Session: 90 min  Stress: No Stress Concern Present (01/23/2024)   [employer] of Occupational Health - Occupational Stress Questionnaire    Feeling of Stress : Only a little  Social Connections: Socially Isolated (01/23/2024)   Social Connection and Isolation Panel    Frequency of Communication with Friends and Family: Never    Frequency of Social Gatherings with Friends and Family: More than three times a week    Attends Religious Services: Never    Database Administrator or Organizations: No    Attends Engineer, Structural: Not on file    Marital Status: Divorced  Intimate Partner Violence: Not At Risk (04/21/2022)   Received from [facility]   HITS    Over the last 12 months how often did your partner physically hurt you?: Never    Over the last 12 months how often did your partner insult you or talk down to you?: Never    Over the last 12 months how often did your partner threaten you with physical harm?: Never    Over the last 12 months how often did your partner scream or curse at you?: Never  Depression (PHQ2-9): Low Risk (01/30/2024)   Depression (PHQ2-9)    PHQ-2 Score: 1  Alcohol Screen: Low Risk (01/23/2024)   Alcohol Screen    Last Alcohol Screening Score (AUDIT): 3  Housing: Unknown (05/20/2024)   Received  from [facility]   Epic    At any time in the past 12 months, were you homeless or living in a shelter (including now)?: No    Number of Times Moved in the Last Year: Not on file    Unable to Pay for Housing in the Last Year: Not on file  Utilities: Not on file  Health Literacy: Not on file      PHYSICAL EXAM Generalized: Well developed, in no acute distress   Neurological examination  Mentation: Alert oriented to time, place, history taking. Follows all commands speech and language fluent Cranial nerve II-XII: Facial symmetry noted  DIAGNOSTIC DATA (LABS, IMAGING, TESTING) - I reviewed patient records, labs, notes, testing and imaging myself where available.  Lab Results  Component Value Date   WBC 7.1 11/01/2024   HGB 14.9 11/01/2024   HCT 44.4 11/01/2024   MCV 88 11/01/2024   PLT 232 11/01/2024      Component Value Date/Time   NA 143 11/01/2024 0801   K 4.3 11/01/2024 0801   CL 105 11/01/2024 0801   CO2 25 11/01/2024 0801   GLUCOSE 94 11/01/2024 0801   GLUCOSE 105 (H) 05/04/2024 0920   BUN 16 11/01/2024 0801   CREATININE 1.02 11/01/2024 0801   CALCIUM 9.6 11/01/2024 0801   PROT 6.5 11/01/2024 0801   ALBUMIN 4.3 11/01/2024 0801   AST 11 11/01/2024 0801   ALT 13 11/01/2024 0801   ALKPHOS 55 11/01/2024 0801   BILITOT 0.6 11/01/2024 0801   GFRNONAA >60 01/22/2019 1533   GFRAA >60 01/22/2019 1533   Lab Results  Component Value Date   CHOL 159 11/01/2024   HDL 42 11/01/2024   LDLCALC 97 11/01/2024   TRIG 111 11/01/2024   CHOLHDL 3.8 11/01/2024   Lab Results  Component Value Date   HGBA1C 4.8 11/01/2024  Lab Results  Component Value Date   VITAMINB12 531 02/06/2023   Lab Results  Component Value Date   TSH 1.610 02/06/2023      ASSESSMENT AND PLAN 38 y.o. year old male  has a past medical history of Allergies, History of kidney stones, OSA on CPAP, Palpitations, Pericardial cyst, and Sleep apnea. here with:  OSA on CPAP  CPAP  compliance excellent Residual AHI is good Encouraged patient to continue using CPAP nightly and > 4 hours each night Once he reaches goal weight we can consider repeating HST-advised to message me in June if he gets his weight and we will repeat F/U in 1 year or sooner if needed    Duwaine Russell, MSN, NP-C 12/08/2024, 3:32 PM [facility] [address] [place], [place] [address] [phone]  The patient's condition requires frequent monitoring and adjustments in the treatment plan, reflecting the ongoing complexity of care.  This provider is the continuing focal point for all needed services for this condition.

## 2024-12-28 ENCOUNTER — Encounter: Payer: Self-pay | Admitting: Adult Health

## 2024-12-28 DIAGNOSIS — G4733 Obstructive sleep apnea (adult) (pediatric): Secondary | ICD-10-CM

## 2025-01-03 NOTE — Addendum Note (Signed)
 Addended by: CHALICE SAUNAS on: 01/03/2025 05:58 PM   Modules accepted: Orders

## 2025-01-03 NOTE — Addendum Note (Signed)
 Addended by: SHONA SAVANT A on: 01/03/2025 03:42 PM   Modules accepted: Orders

## 2025-01-04 ENCOUNTER — Telehealth: Payer: Self-pay | Admitting: Neurology

## 2025-01-04 NOTE — Telephone Encounter (Signed)
 Referral for dentistry fax to [facility]. Phone: [phone], Fax: [phone]

## 2025-01-04 NOTE — Telephone Encounter (Signed)
 Referral for dentistry to [facility] ' referral form has been put in Dr. Lionell office for her signature,.

## 2025-01-04 NOTE — Telephone Encounter (Signed)
 Dental device referral signed by provider and given to [employer]

## 2025-01-12 ENCOUNTER — Ambulatory Visit: Admitting: Family Medicine

## 2025-01-12 ENCOUNTER — Encounter: Payer: Self-pay | Admitting: Family Medicine

## 2025-01-12 VITALS — BP 132/88 | HR 63 | Temp 98.0°F | Ht 67.0 in | Wt 235.0 lb

## 2025-01-12 DIAGNOSIS — E66812 Obesity, class 2: Secondary | ICD-10-CM | POA: Diagnosis not present

## 2025-01-12 DIAGNOSIS — Z6836 Body mass index (BMI) 36.0-36.9, adult: Secondary | ICD-10-CM | POA: Diagnosis not present

## 2025-01-12 DIAGNOSIS — E88819 Insulin resistance, unspecified: Secondary | ICD-10-CM

## 2025-01-12 DIAGNOSIS — G4733 Obstructive sleep apnea (adult) (pediatric): Secondary | ICD-10-CM

## 2025-01-12 MED ORDER — ZEPBOUND 2.5 MG/0.5ML ~~LOC~~ SOAJ: 2.5000 mg | SUBCUTANEOUS | 1 refills | Status: AC

## 2025-01-12 NOTE — Progress Notes (Signed)
 "  Office: [phone]  /  Fax: [phone]  WEIGHT SUMMARY AND BIOMETRICS  Starting Date: 02/06/23  Starting Weight: 291lb   Weight Lost Since Last Visit: 11lb   Vitals Temp: 98 F (36.7 C) BP: 132/88 Pulse Rate: 63 SpO2: 99 %   Body Composition  Body Fat %: 30.1 % Fat Mass (lbs): 70.8 lbs Muscle Mass (lbs): 156.4 lbs Total Body Water (lbs): 115.8 lbs Visceral Fat Rating : 15    HPI  Chief Complaint: OBESITY  [name] is here to discuss his progress with his obesity treatment plan. He is on the the Category 4 Plan and states he is following his eating plan approximately 80 % of the time. He states he is exercising 60 minutes 3-5 times per week.  Interval History:  Since last office visit he is down 7 lb He is feeling better coming off rhinovirus last visit He is focusing on lean protein and fiber with meals He has been going to the gym 3-5 days/ wk mostly doing weights, some cardio (15 min) He plans to hike more on the weekend He has a net weight loss of 56 lb in 23 mos of medically supervised weight management This is a 19.2% total body weight loss He still has improved satiety with a reduction in portion sizes and snacking on Zepbound  2.5 mg once weekly injection  Pharmacotherapy: Zepbound  2.5 mg weekly  Obesity related comorbidities: Insulin  resistance Obstructive sleep apnea Hepatic steatosis  PHYSICAL EXAM:  Blood pressure 132/88, pulse 63, temperature 98 F (36.7 C), height 5' 7 (1.702 m), weight 235 lb (106.6 kg), SpO2 99%. Body mass index is 36.81 kg/m.  General: He is overweight, cooperative, alert, well developed, and in no acute distress. PSYCH: Has normal mood, affect and thought process.   Lungs: Normal breathing effort, no conversational dyspnea.  ASSESSMENT AND PLAN  TREATMENT PLAN FOR OBESITY:  Recommended Dietary Goals  [name] is currently in the action stage of change. As such, his goal is to continue weight management  plan. He has agreed to keeping a food journal and adhering to recommended goals of 2000 calories and 100+ grams of protein.  Behavioral Intervention  We discussed the following Behavioral Modification Strategies today: increasing lean protein intake to established goals, increasing fiber rich foods, increasing water intake , work on meal planning and preparation, work on counselling psychologist calories using tracking application, keeping healthy foods at home, work on managing stress, creating time for self-care and relaxation, avoiding temptations and identifying enticing environmental cues, continue to practice mindfulness when eating, and planning for success.  Additional resources provided today: NA  Recommended Physical Activity Goals  Philander has been advised to work up to 150 minutes of moderate intensity aerobic activity a week and strengthening exercises 2-3 times per week for cardiovascular health, weight loss maintenance and preservation of muscle mass.   He has agreed to Increase the intensity, frequency or duration of aerobic exercises    Pharmacotherapy changes for the treatment of obesity: None  ASSOCIATED CONDITIONS ADDRESSED TODAY  OSA on CPAP Doing well on Zepbound  2.5 mg once weekly injection and wearing CPAP nightly aiming for 7 to 8 hours of sleep at night.  Reviewed office visit notes from neurology dated 12/08/2024.  He is inquiring about an oral device for OSA treatment.  -     Zepbound ; Inject 2.5 mg into the skin once a week.  Dispense: 2 mL; Refill: 1  Obesity, Class II, BMI 35-39.9 He has adequate satiety on  2.5 mg once weekly injection of Zepbound  with improved weight reduction.  He is getting in lean protein and fiber with meals with a minimal loss of lean body mass.  He is doing great with weight training 3 to 5 days a week.  Recommend increasing walking time as his job is sedentary.  He has lost 90% of his total body weight and 23 months of medically  supervised weight management.  He is within 20 pounds approximately of his goal weight. -     Zepbound ; Inject 2.5 mg into the skin once a week.  Dispense: 2 mL; Refill: 1  BMI 36.0-36.9,adult  Insulin  resistance Improving.  Last labs obtained 11/01/2024.  Fasting insulin  was still elevated at 24.7 and he has associated fatty liver.  Continue to reduce intake of saturated fats and added sugar.  He is doing great with regular exercise and weight reduction.  Previously had adverse side effects from metformin .  Doing well on Zepbound .     He was informed of the importance of frequent follow up visits to maximize his success with intensive lifestyle modifications for his multiple health conditions.   ATTESTASTION STATEMENTS:  Reviewed by clinician on day of visit: allergies, medications, problem list, medical history, surgical history, family history, social history, and previous encounter notes pertinent to obesity diagnosis.   I have personally spent 30 minutes total time today in preparation, patient care, nutritional counseling and education,  and documentation for this visit, including the following: review of most recent clinical lab tests, prescribing medications/ refilling medications, reviewing medical assistant documentation, review and interpretation of bioimpedence results.     Darice Haddock, D.O. DABFM, DABOM [facility] [address] [place], [place] [address] [phone] "

## 2025-02-01 ENCOUNTER — Ambulatory Visit: Payer: Managed Care, Other (non HMO) | Admitting: Family

## 2025-02-15 ENCOUNTER — Ambulatory Visit: Admitting: Family Medicine

## 2025-03-23 ENCOUNTER — Ambulatory Visit: Admitting: Adult Health

## 2025-09-28 ENCOUNTER — Ambulatory Visit: Admitting: Internal Medicine

## 2025-12-08 ENCOUNTER — Telehealth: Admitting: Adult Health
